# Patient Record
Sex: Male | Born: 1955 | Race: White | Hispanic: No | Marital: Married | Smoking: Former smoker
Health system: Southern US, Community
[De-identification: ages and names within clinical notes are randomized; demographics above are authoritative.]

## PROBLEM LIST (undated history)

## (undated) DIAGNOSIS — Z973 Presence of spectacles and contact lenses: Secondary | ICD-10-CM

## (undated) DIAGNOSIS — I2510 Atherosclerotic heart disease of native coronary artery without angina pectoris: Secondary | ICD-10-CM

## (undated) DIAGNOSIS — I10 Essential (primary) hypertension: Secondary | ICD-10-CM

## (undated) DIAGNOSIS — Z9989 Dependence on other enabling machines and devices: Secondary | ICD-10-CM

## (undated) DIAGNOSIS — J189 Pneumonia, unspecified organism: Secondary | ICD-10-CM

## (undated) DIAGNOSIS — H409 Unspecified glaucoma: Secondary | ICD-10-CM

## (undated) DIAGNOSIS — K219 Gastro-esophageal reflux disease without esophagitis: Secondary | ICD-10-CM

## (undated) DIAGNOSIS — M1990 Unspecified osteoarthritis, unspecified site: Secondary | ICD-10-CM

## (undated) DIAGNOSIS — G4733 Obstructive sleep apnea (adult) (pediatric): Secondary | ICD-10-CM

## (undated) DIAGNOSIS — Z87442 Personal history of urinary calculi: Secondary | ICD-10-CM

## (undated) DIAGNOSIS — I517 Cardiomegaly: Secondary | ICD-10-CM

## (undated) DIAGNOSIS — N189 Chronic kidney disease, unspecified: Secondary | ICD-10-CM

## (undated) HISTORY — PX: EYE SURGERY: SHX253

## (undated) HISTORY — PX: CATARACT EXTRACTION W/ INTRAOCULAR LENS IMPLANT: SHX1309

## (undated) HISTORY — PX: BACK SURGERY: SHX140

## (undated) HISTORY — DX: Chronic kidney disease, unspecified: N18.9

## (undated) HISTORY — DX: Cardiomegaly: I51.7

## (undated) HISTORY — PX: TONSILLECTOMY: SUR1361

## (undated) HISTORY — PX: COLONOSCOPY: SHX174

## (undated) HISTORY — DX: Essential (primary) hypertension: I10

## (undated) HISTORY — PX: REFRACTIVE SURGERY: SHX103

## (undated) HISTORY — DX: Unspecified osteoarthritis, unspecified site: M19.90

---

## 1998-08-10 ENCOUNTER — Observation Stay (HOSPITAL_COMMUNITY): Admission: RE | Admit: 1998-08-10 | Discharge: 1998-08-11 | Payer: Self-pay | Admitting: Specialist

## 1998-08-10 ENCOUNTER — Encounter: Payer: Self-pay | Admitting: Specialist

## 1998-09-09 HISTORY — PX: LUMBAR DISC SURGERY: SHX700

## 1999-03-03 ENCOUNTER — Encounter: Payer: Self-pay | Admitting: Emergency Medicine

## 1999-03-03 ENCOUNTER — Emergency Department (HOSPITAL_COMMUNITY): Admission: EM | Admit: 1999-03-03 | Discharge: 1999-03-03 | Payer: Self-pay | Admitting: Emergency Medicine

## 2011-07-09 ENCOUNTER — Encounter: Payer: Self-pay | Admitting: Gastroenterology

## 2011-07-29 ENCOUNTER — Encounter: Payer: Self-pay | Admitting: Gastroenterology

## 2011-07-29 ENCOUNTER — Ambulatory Visit (AMBULATORY_SURGERY_CENTER): Payer: BC Managed Care – PPO | Admitting: *Deleted

## 2011-07-29 VITALS — Ht 71.0 in | Wt 200.0 lb

## 2011-07-29 DIAGNOSIS — Z1211 Encounter for screening for malignant neoplasm of colon: Secondary | ICD-10-CM

## 2011-07-29 MED ORDER — PEG-KCL-NACL-NASULF-NA ASC-C 100 G PO SOLR: ORAL | Status: DC

## 2011-08-12 ENCOUNTER — Encounter: Payer: Self-pay | Admitting: Gastroenterology

## 2011-08-12 ENCOUNTER — Ambulatory Visit (AMBULATORY_SURGERY_CENTER): Payer: BC Managed Care – PPO | Admitting: Gastroenterology

## 2011-08-12 VITALS — BP 99/70 | HR 64 | Temp 97.9°F | Resp 20 | Ht 71.0 in | Wt 200.0 lb

## 2011-08-12 DIAGNOSIS — Z1211 Encounter for screening for malignant neoplasm of colon: Secondary | ICD-10-CM

## 2011-08-12 DIAGNOSIS — D126 Benign neoplasm of colon, unspecified: Secondary | ICD-10-CM

## 2011-08-12 LAB — HM COLONOSCOPY

## 2011-08-12 MED ORDER — SODIUM CHLORIDE 0.9 % IV SOLN: 500.0000 mL | INTRAVENOUS | Status: DC

## 2011-08-12 NOTE — Patient Instructions (Signed)
Please review discharge instructions (blue and green sheets)  Await pathology- we removed 3 polyps  Resume your regular medications

## 2011-08-12 NOTE — Progress Notes (Signed)
Patient did not have preoperative order for IV antibiotic SSI prophylaxis. (G8918)  Patient did not experience any of the following events: a burn prior to discharge; a fall within the facility; wrong site/side/patient/procedure/implant event; or a hospital transfer or hospital admission upon discharge from the facility. (G8907)  

## 2011-08-13 ENCOUNTER — Telehealth: Payer: Self-pay | Admitting: *Deleted

## 2011-08-13 NOTE — Telephone Encounter (Signed)

## 2012-03-25 ENCOUNTER — Emergency Department (HOSPITAL_COMMUNITY): Payer: BC Managed Care – PPO

## 2012-03-25 ENCOUNTER — Encounter (HOSPITAL_COMMUNITY): Payer: Self-pay

## 2012-03-25 ENCOUNTER — Emergency Department (HOSPITAL_COMMUNITY)
Admission: EM | Admit: 2012-03-25 | Discharge: 2012-03-25 | Disposition: A | Discharge status: Home / Self Care | Payer: BC Managed Care – PPO | Attending: Emergency Medicine | Admitting: Emergency Medicine

## 2012-03-25 DIAGNOSIS — G4730 Sleep apnea, unspecified: Secondary | ICD-10-CM | POA: Insufficient documentation

## 2012-03-25 DIAGNOSIS — N200 Calculus of kidney: Secondary | ICD-10-CM | POA: Insufficient documentation

## 2012-03-25 DIAGNOSIS — Z87891 Personal history of nicotine dependence: Secondary | ICD-10-CM | POA: Insufficient documentation

## 2012-03-25 LAB — URINALYSIS, ROUTINE W REFLEX MICROSCOPIC
Nitrite: NEGATIVE
Specific Gravity, Urine: 1.028 (ref 1.005–1.030)
Urobilinogen, UA: 1 mg/dL (ref 0.0–1.0)

## 2012-03-25 LAB — URINE MICROSCOPIC-ADD ON

## 2012-03-25 LAB — CBC WITH DIFFERENTIAL/PLATELET
Basophils Absolute: 0 10*3/uL (ref 0.0–0.1)
HCT: 45 % (ref 39.0–52.0)
Lymphocytes Relative: 23 % (ref 12–46)
Monocytes Absolute: 0.8 10*3/uL (ref 0.1–1.0)
Neutro Abs: 3.7 10*3/uL (ref 1.7–7.7)
Platelets: 213 10*3/uL (ref 150–400)
RDW: 12.6 % (ref 11.5–15.5)
WBC: 6 10*3/uL (ref 4.0–10.5)

## 2012-03-25 LAB — COMPREHENSIVE METABOLIC PANEL
Alkaline Phosphatase: 64 U/L (ref 39–117)
BUN: 14 mg/dL (ref 6–23)
Creatinine, Ser: 0.92 mg/dL (ref 0.50–1.35)
GFR calc Af Amer: >90 mL/min (ref >90)
Glucose, Bld: 106 mg/dL — ABNORMAL HIGH (ref 70–99)
Potassium: 4.2 mEq/L (ref 3.5–5.1)
Total Protein: 6.8 g/dL (ref 6.0–8.3)

## 2012-03-25 LAB — LIPASE, BLOOD: Lipase: 43 U/L (ref 11–59)

## 2012-03-25 MED ORDER — ONDANSETRON HCL 4 MG/2ML IJ SOLN: 4.0000 mg | Freq: Once | INTRAMUSCULAR | Status: DC

## 2012-03-25 MED ORDER — SODIUM CHLORIDE 0.9 % IV BOLUS (SEPSIS): 1000.0000 mL | Freq: Once | INTRAVENOUS | Status: DC

## 2012-03-25 MED ORDER — TAMSULOSIN HCL 0.4 MG PO CAPS: 0.4000 mg | ORAL_CAPSULE | Freq: Every day | ORAL | Status: DC

## 2012-03-25 MED ORDER — HYDROCODONE-ACETAMINOPHEN 5-325 MG PO TABS: 1.0000 | ORAL_TABLET | ORAL | Status: AC | PRN

## 2012-03-25 MED ORDER — HYDROMORPHONE HCL PF 1 MG/ML IJ SOLN: 0.5000 mg | Freq: Once | INTRAMUSCULAR | Status: DC

## 2012-03-25 MED ORDER — KETOROLAC TROMETHAMINE 30 MG/ML IJ SOLN: 30.0000 mg | Freq: Once | INTRAMUSCULAR | Status: DC

## 2012-03-25 NOTE — ED Notes (Signed)
Pt states he does not want an iv or pain medication at this time. Pt states he may have passed the stone early. edmd notified

## 2012-03-25 NOTE — ED Notes (Signed)
Patient c/o right flank pain and has a history of kidney stones.  Patient states the pain is not as bad as it was earlier. Patient denies hematuria.

## 2012-03-25 NOTE — ED Provider Notes (Addendum)
History     CSN: [id number]  Arrival date & time 03/25/12  1219   First MD Initiated Contact with Patient 03/25/12 1220      Chief Complaint  Patient presents with  . Flank Pain    (Consider location/radiation/quality/duration/timing/severity/associated sxs/prior treatment) Patient is a 56 y.o. male presenting with flank pain.  Flank Pain   the patient presents with the acute onset of flank pain.  He notes that approximately 2 hours ago while sitting he suddenly developed sharp, crampy pain about the right flank.  Pain radiated circumferentially towards his inguinal crease and into his scrotum.  The pain was most prominent at onset, has improved spontaneously without specific attempts at relief.  He notes that there was initially a sudden urge to urinate, but there is no urine production.  He denies other abdominal pain, fevers, chills, nausea, vomiting, diarrhea. He states that this pain is reminiscent of pain he had approximately 12 years ago during a kidney stone episode.  Past Medical History  Diagnosis Date  . Allergy     poison ivy and oak  . Sleep apnea     c-pap  . Kidney stone     Past Surgical History  Procedure Date  . Lumbar disc surgery 2000    L4-5  . Conoscopy     History reviewed. No pertinent family history.  History  Substance Use Topics  . Smoking status: Former Games developer  . Smokeless tobacco: Never Used  . Alcohol Use: 0.6 oz/week    1 Cans of beer per week     daily      Review of Systems  Constitutional:       Per HPI, otherwise negative  HENT:       Per HPI, otherwise negative  Eyes: Negative.   Respiratory:       Per HPI, otherwise negative  Cardiovascular:       Per HPI, otherwise negative  Gastrointestinal: Negative for vomiting.  Genitourinary: Positive for flank pain.  Musculoskeletal:       Per HPI, otherwise negative  Skin: Negative.   Neurological: Negative for syncope.    Allergies  Review of patient's allergies  indicates no known allergies.  Home Medications  No current outpatient prescriptions on file.  BP 146/91  Pulse 71  Temp 98.6 F (37 C)  Resp 20  SpO2 100%  Physical Exam  Nursing note and vitals reviewed. Constitutional: He is oriented to person, place, and time. He appears well-developed. No distress.  HENT:  Head: Normocephalic and atraumatic.  Eyes: Conjunctivae and EOM are normal.  Cardiovascular: Normal rate and regular rhythm.   Pulmonary/Chest: Effort normal. No stridor. No respiratory distress.  Abdominal: He exhibits no distension. There is tenderness. There is CVA tenderness.  Musculoskeletal: He exhibits no edema.  Neurological: He is alert and oriented to person, place, and time.  Skin: Skin is warm and dry.  Psychiatric: He has a normal mood and affect.    ED Course  Procedures (including critical care time)   Labs Reviewed  COMPREHENSIVE METABOLIC PANEL  CBC WITH DIFFERENTIAL  LIPASE, BLOOD  URINALYSIS, ROUTINE W REFLEX MICROSCOPIC   No results found.   No diagnosis found.   After my initial evaluation the patient went to the washroom to provide a sample, noted the passage of something solid in his urine MDM  This generally well-appearing male presents with the acute onset of right flank pain.  Given the patient's history of kidney stones, the description of sharp  and crampy pain in his right flank there was immediate suspicion of kidney stone.  Patient's urinalysis and CT scan are consistent with the presence of multiple stones.  There is no evidence of ongoing infection.  Patient was discharged in stable condition to followup with urology for continued evaluation of multiple remaining stones      Gerhard Munch, MD 03/25/12 1450  Gerhard Munch, MD 04/09/12 [phone]

## 2012-05-21 ENCOUNTER — Ambulatory Visit: Payer: Self-pay

## 2012-11-04 ENCOUNTER — Telehealth: Payer: Self-pay | Admitting: Radiology

## 2012-11-04 NOTE — Telephone Encounter (Signed)
Pharmacy ? Written rx they will send [place] the hard copy.

## 2013-07-10 IMAGING — CT CT ABD-PELV W/O CM
1 series · 16 of 30 positions shown, 20 images · non-contrast
Comparison: None.

CLINICAL DATA: Right flank pain.

CT ABDOMEN AND PELVIS WITHOUT CONTRAST
TECHNIQUE: Multidetector CT imaging of the abdomen and pelvis was
performed following the standard protocol without intravenous
contrast.

[Series 4: lung · axial · 0.78mm/px · z∈[-238,-108]mm · 16 of 30 slices shown, 20 images]
[im 3/30  soft-tissue]
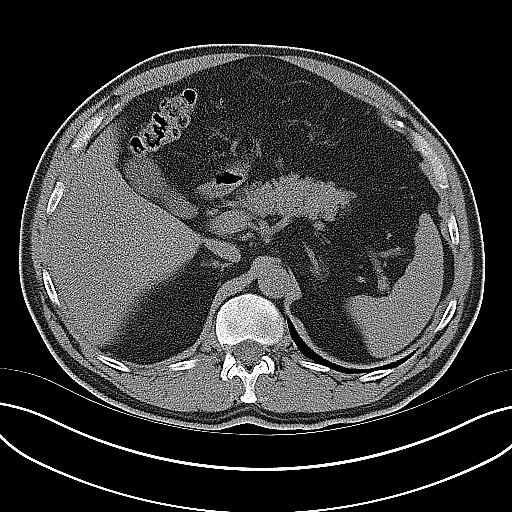
[im 3/30  bone]
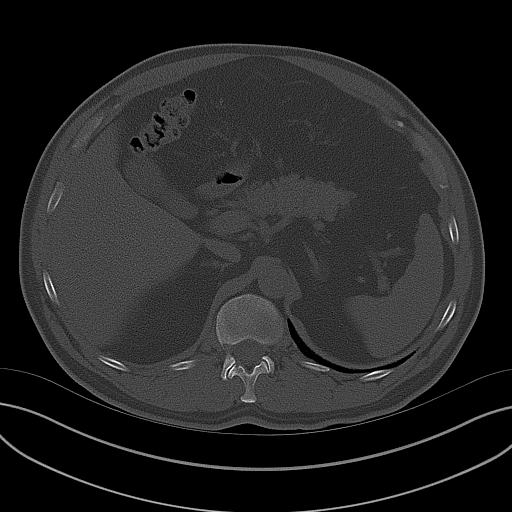
[im 5/30  soft-tissue]
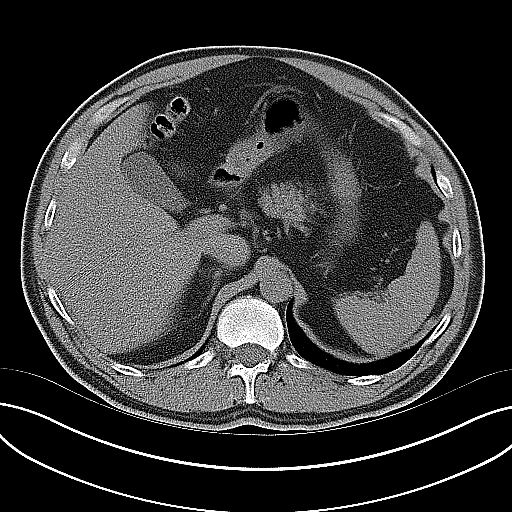
[im 7/30  soft-tissue]
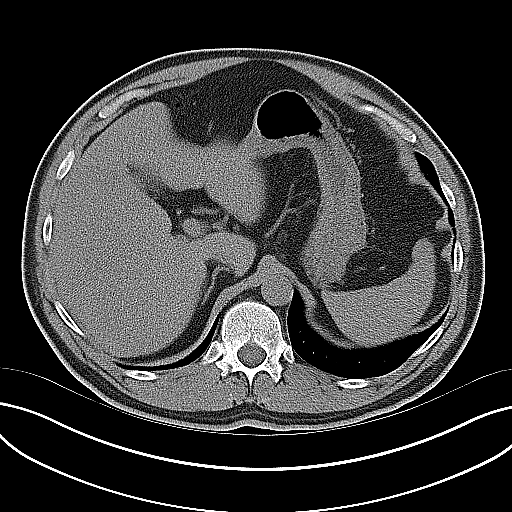
[im 9/30  soft-tissue]
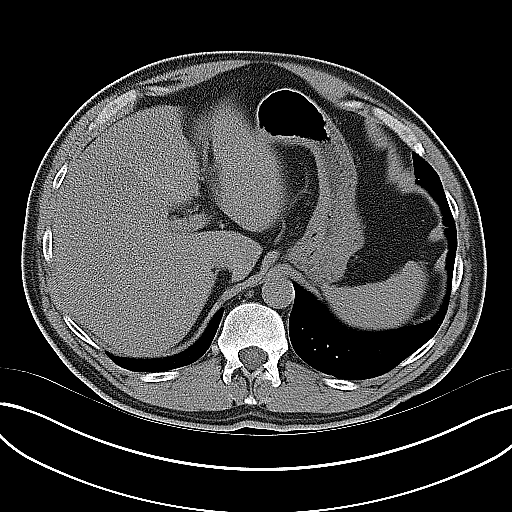
[im 11/30  soft-tissue]
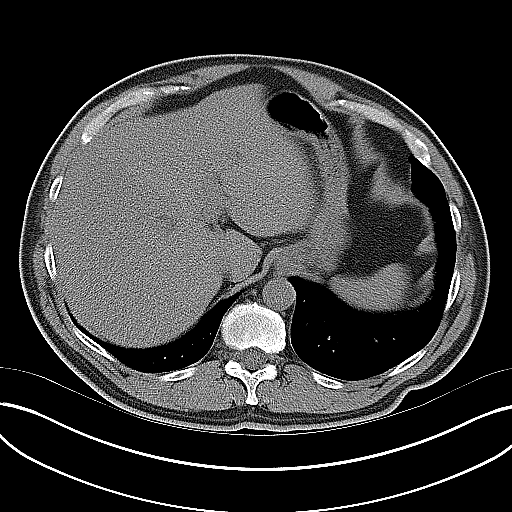
[im 13/30  soft-tissue]
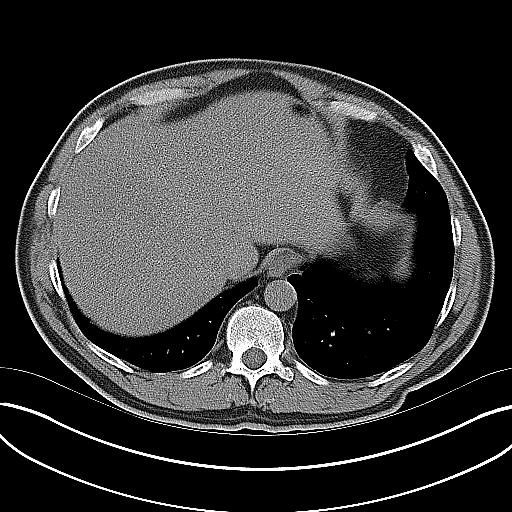
[im 15/30  soft-tissue]
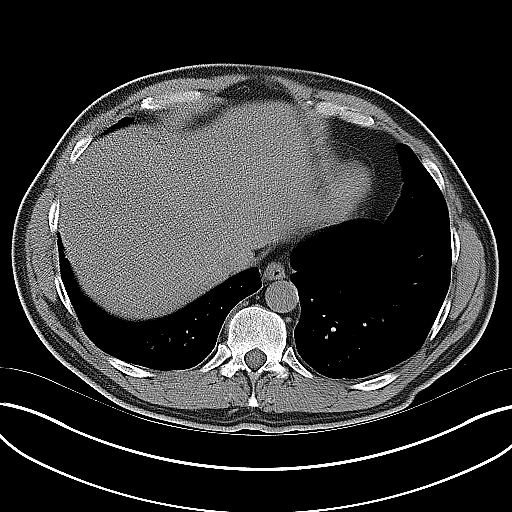
[im 17/30  soft-tissue]
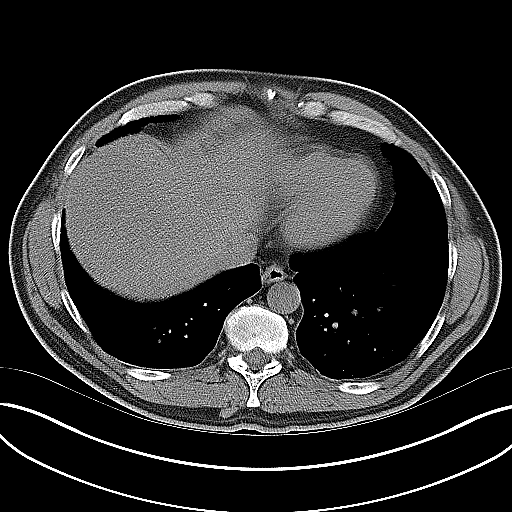
[im 19/30  soft-tissue]
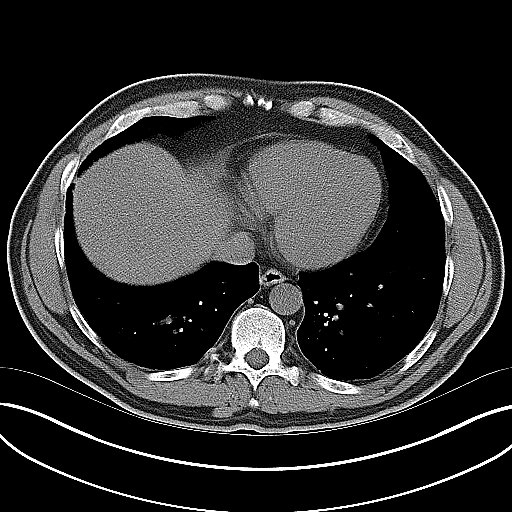
[im 19/30  bone]
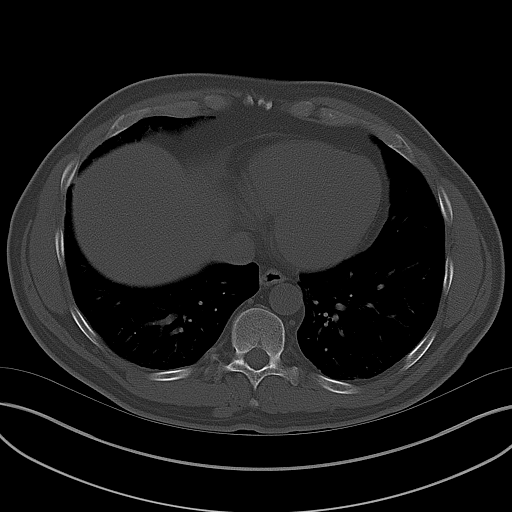
[im 21/30  soft-tissue]
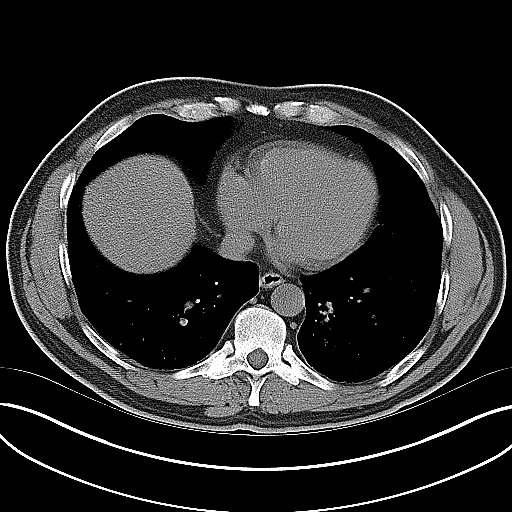
[im 23/30  soft-tissue]
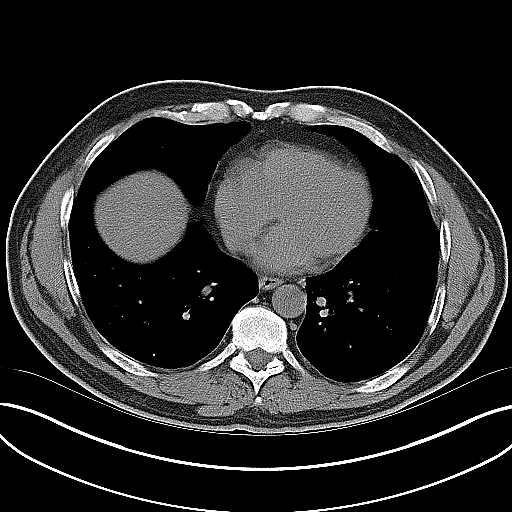
[im 25/30  soft-tissue]
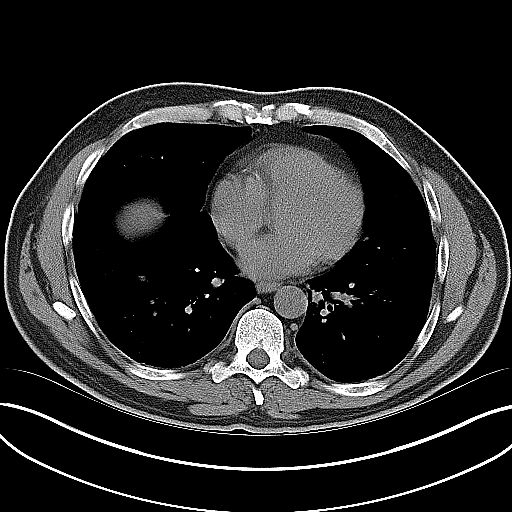
[im 26/30  lung]
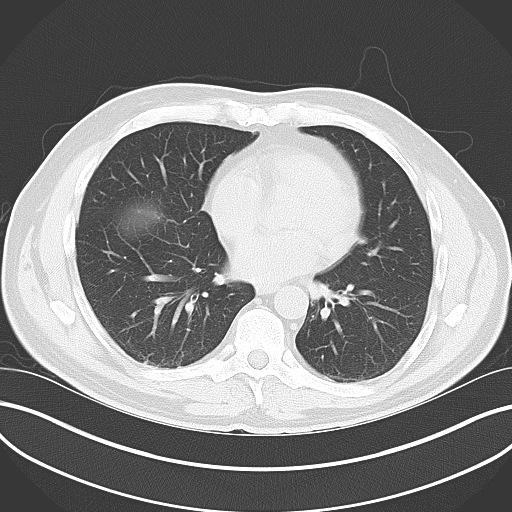
[im 27/30  soft-tissue]
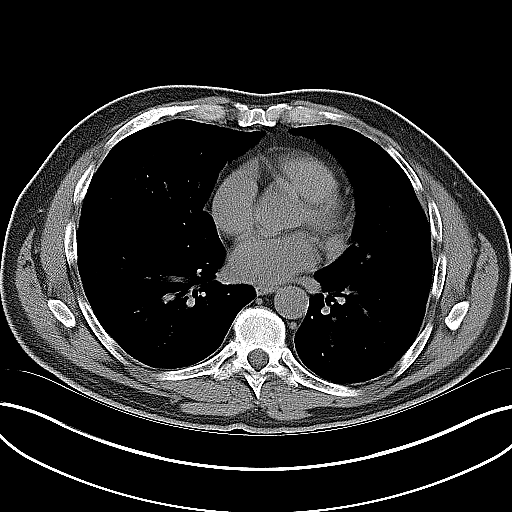
[im 27/30  lung]
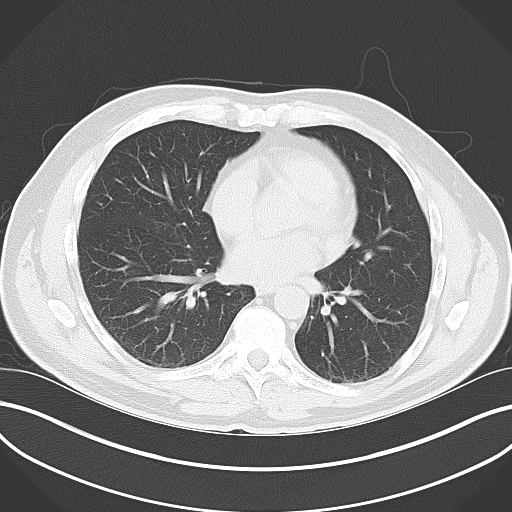
[im 28/30  lung]
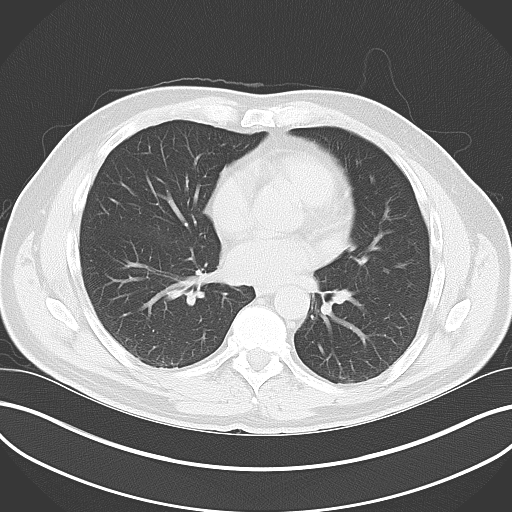
[im 29/30  soft-tissue]
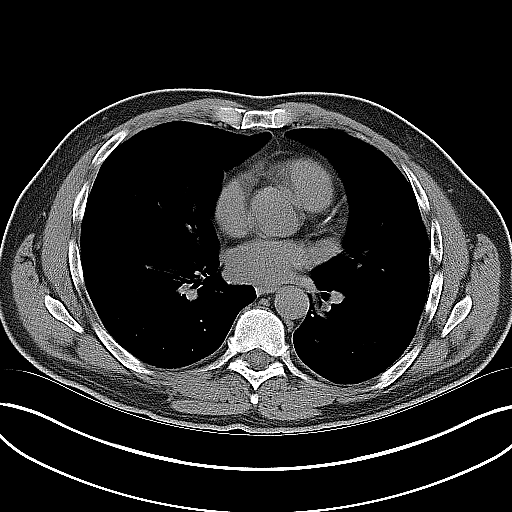
[im 29/30  lung]
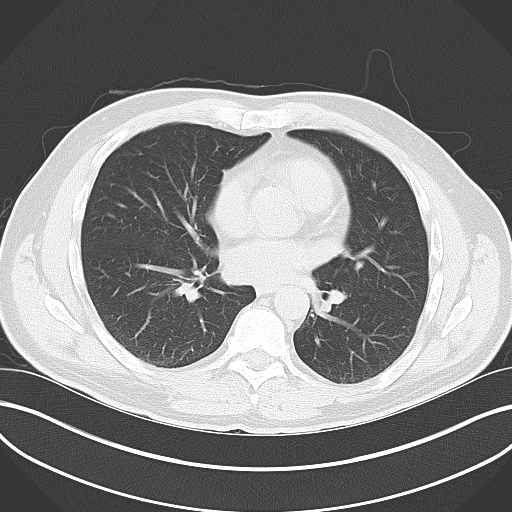

[16 of 30 positions shown; findings below may reference images not displayed]

FINDINGS: Lung bases are clear.  No effusions.  Heart is normal
size.  Coronary artery calcifications.

Mild diffuse fatty infiltration of the liver suspected with area of
focal fatty sparing near the gallbladder fossa.  Gallbladder,
spleen, pancreas, adrenals and left kidney have an unremarkable
unenhanced appearance.  Multiple nonobstructing right renal stones,
the largest in the lower pole measuring 11 mm.  No hydronephrosis.
No ureteral stones.  Urinary bladder is unremarkable.

Appendix is visualized and is unremarkable. Bowel grossly
unremarkable.  No free fluid, free air, or adenopathy. No acute
bony abnormality.  Degenerative changes at the L5 S1 level.
IMPRESSION: Right nephrolithiasis.  No ureteral stones or hydronephrosis.

No acute findings.

## 2013-09-09 HISTORY — PX: CATARACT EXTRACTION W/ INTRAOCULAR LENS IMPLANT: SHX1309

## 2014-04-04 HISTORY — PX: RETINAL DETACHMENT SURGERY: SHX105

## 2014-04-13 ENCOUNTER — Emergency Department (HOSPITAL_COMMUNITY): Payer: BC Managed Care – PPO

## 2014-04-13 ENCOUNTER — Other Ambulatory Visit: Payer: Self-pay | Admitting: Urology

## 2014-04-13 ENCOUNTER — Encounter (HOSPITAL_COMMUNITY): Payer: Self-pay | Admitting: Emergency Medicine

## 2014-04-13 ENCOUNTER — Emergency Department (HOSPITAL_COMMUNITY)
Admission: EM | Admit: 2014-04-13 | Discharge: 2014-04-13 | Disposition: A | Discharge status: Home / Self Care | Payer: BC Managed Care – PPO | Attending: Emergency Medicine | Admitting: Emergency Medicine

## 2014-04-13 DIAGNOSIS — N23 Unspecified renal colic: Secondary | ICD-10-CM | POA: Insufficient documentation

## 2014-04-13 DIAGNOSIS — IMO0002 Reserved for concepts with insufficient information to code with codable children: Secondary | ICD-10-CM | POA: Insufficient documentation

## 2014-04-13 DIAGNOSIS — Z79899 Other long term (current) drug therapy: Secondary | ICD-10-CM | POA: Insufficient documentation

## 2014-04-13 DIAGNOSIS — G4730 Sleep apnea, unspecified: Secondary | ICD-10-CM | POA: Insufficient documentation

## 2014-04-13 DIAGNOSIS — Z87891 Personal history of nicotine dependence: Secondary | ICD-10-CM | POA: Insufficient documentation

## 2014-04-13 DIAGNOSIS — Z87442 Personal history of urinary calculi: Secondary | ICD-10-CM | POA: Insufficient documentation

## 2014-04-13 DIAGNOSIS — R109 Unspecified abdominal pain: Secondary | ICD-10-CM | POA: Insufficient documentation

## 2014-04-13 LAB — BASIC METABOLIC PANEL
Anion gap: 15 (ref 5–15)
BUN: 15 mg/dL (ref 6–23)
CHLORIDE: 105 meq/L (ref 96–112)
CO2: 16 mEq/L — ABNORMAL LOW (ref 19–32)
Calcium: 9.5 mg/dL (ref 8.4–10.5)
Creatinine, Ser: 1.07 mg/dL (ref 0.50–1.35)
GFR calc Af Amer: 86 mL/min — ABNORMAL LOW (ref >90)
GFR, EST NON AFRICAN AMERICAN: 74 mL/min — AB (ref >90)
GLUCOSE: 163 mg/dL — AB (ref 70–99)
POTASSIUM: 4.5 meq/L (ref 3.7–5.3)
SODIUM: 136 meq/L — AB (ref 137–147)

## 2014-04-13 LAB — URINALYSIS, ROUTINE W REFLEX MICROSCOPIC
Bilirubin Urine: NEGATIVE
GLUCOSE, UA: NEGATIVE mg/dL
Hgb urine dipstick: NEGATIVE
Ketones, ur: NEGATIVE mg/dL
Nitrite: NEGATIVE
PROTEIN: NEGATIVE mg/dL
SPECIFIC GRAVITY, URINE: 1.018 (ref 1.005–1.030)
UROBILINOGEN UA: 1 mg/dL (ref 0.0–1.0)
pH: 7 (ref 5.0–8.0)

## 2014-04-13 LAB — CBC WITH DIFFERENTIAL/PLATELET
BASOS PCT: 0 % (ref 0–1)
Basophils Absolute: 0 10*3/uL (ref 0.0–0.1)
EOS ABS: 0 10*3/uL (ref 0.0–0.7)
EOS PCT: 0 % (ref 0–5)
HEMATOCRIT: 50.4 % (ref 39.0–52.0)
HEMOGLOBIN: 17.5 g/dL — AB (ref 13.0–17.0)
LYMPHS PCT: 15 % (ref 12–46)
Lymphs Abs: 2.1 10*3/uL (ref 0.7–4.0)
MCH: 32.8 pg (ref 26.0–34.0)
MCHC: 34.7 g/dL (ref 30.0–36.0)
MCV: 94.4 fL (ref 78.0–100.0)
MONOS PCT: 8 % (ref 3–12)
Monocytes Absolute: 1.1 10*3/uL — ABNORMAL HIGH (ref 0.1–1.0)
NEUTROS ABS: 10.8 10*3/uL — AB (ref 1.7–7.7)
NEUTROS PCT: 77 % (ref 43–77)
Platelets: 228 10*3/uL (ref 150–400)
RBC: 5.34 MIL/uL (ref 4.22–5.81)
RDW: 12.4 % (ref 11.5–15.5)
WBC: 14 10*3/uL — AB (ref 4.0–10.5)

## 2014-04-13 LAB — URINE MICROSCOPIC-ADD ON

## 2014-04-13 MED ORDER — HYDROMORPHONE HCL PF 1 MG/ML IJ SOLN
0.5000 mg | Freq: Once | INTRAMUSCULAR | Status: AC
  Administered 2014-04-13: 0.5 mg via INTRAVENOUS
  Filled 2014-04-13: qty 1

## 2014-04-13 MED ORDER — KETOROLAC TROMETHAMINE 30 MG/ML IJ SOLN
30.0000 mg | Freq: Once | INTRAMUSCULAR | Status: AC
  Administered 2014-04-13: 30 mg via INTRAVENOUS
  Filled 2014-04-13: qty 1

## 2014-04-13 MED ORDER — OXYCODONE-ACETAMINOPHEN 5-325 MG PO TABS: 1.0000 | ORAL_TABLET | ORAL | Status: DC | PRN

## 2014-04-13 MED ORDER — TAMSULOSIN HCL 0.4 MG PO CAPS: 0.4000 mg | ORAL_CAPSULE | Freq: Every day | ORAL | Status: DC

## 2014-04-13 MED ORDER — ONDANSETRON HCL 4 MG/2ML IJ SOLN
4.0000 mg | Freq: Once | INTRAMUSCULAR | Status: AC
  Administered 2014-04-13: 4 mg via INTRAVENOUS
  Filled 2014-04-13: qty 2

## 2014-04-13 NOTE — ED Notes (Signed)
MD at bedside. EDPA PRESENT TO RE EVALUATE PT

## 2014-04-13 NOTE — ED Provider Notes (Signed)
CSN: [id number]     Arrival date & time 04/13/14  0616 History   First MD Initiated Contact with Patient 04/13/14 [phone]     Chief Complaint  Patient presents with  . Flank Pain     (Consider location/radiation/quality/duration/timing/severity/associated sxs/prior Treatment) HPI Steve Howell is a 58 y.o. male who presents to ED with complaint of left flank pain. Pt states he woke up at 4;30 am with severe pain in the side. States hx of similar pain 2 years ago when was diagnosed with a "pocket of multiple stones." States followed by Dr.Eskridge with urology, but was told there is nothing to do for these stones. States he has not had any problems with his side until today. He does recall a "tinge of sharp pain" few days ago, but it lasted just few minutes and went away. Pt admits to associated nausea, no vomiting. No fever, chills. No changes in bowels, but states he is passing a lot of gas. No pain with urination. States urine looks like "there is tissue in it or something."   Past Medical History  Diagnosis Date  . Allergy     poison ivy and oak  . Sleep apnea     c-pap  . Kidney stone    Past Surgical History  Procedure Laterality Date  . Lumbar disc surgery  2000    L4-5  . Conoscopy    . Retinal detachment surgery     History reviewed. No pertinent family history. History  Substance Use Topics  . Smoking status: Former Research scientist (life sciences)  . Smokeless tobacco: Never Used  . Alcohol Use: 0.6 oz/week    1 Cans of beer per week     Comment: daily    Review of Systems  Constitutional: Negative for fever and chills.  Respiratory: Negative for cough, chest tightness and shortness of breath.   Cardiovascular: Negative for chest pain, palpitations and leg swelling.  Gastrointestinal: Positive for abdominal pain. Negative for nausea, vomiting, diarrhea and abdominal distention.  Genitourinary: Positive for flank pain. Negative for dysuria, urgency, frequency and hematuria.   Musculoskeletal: Negative for arthralgias, myalgias, neck pain and neck stiffness.  Skin: Negative for rash.  Allergic/Immunologic: Negative for immunocompromised state.  Neurological: Negative for dizziness, weakness, light-headedness, numbness and headaches.      Allergies  Review of patient's allergies indicates no known allergies.  Home Medications   Prior to Admission medications   Medication Sig Start Date End Date Taking? Authorizing Provider  acetaZOLAMIDE (DIAMOX) 500 MG capsule Take 500 mg by mouth 2 (two) times daily.   Yes Historical Provider, MD  brinzolamide (AZOPT) 1 % ophthalmic suspension Place 1 drop into the right eye 3 (three) times daily.   Yes Historical Provider, MD  latanoprost (XALATAN) 0.005 % ophthalmic solution Place 1 drop into the right eye at bedtime.   Yes Historical Provider, MD  oxyCODONE-acetaminophen (PERCOCET/ROXICET) 5-325 MG per tablet Take 1 tablet by mouth every 4 (four) hours as needed. pain 04/05/14  Yes Historical Provider, MD  prednisoLONE acetate (PRED FORTE) 1 % ophthalmic suspension Place 1 drop into the right eye 4 (four) times daily.    Yes Historical Provider, MD  timolol (BETIMOL) 0.5 % ophthalmic solution Place 1 drop into the right eye 2 (two) times daily.   Yes Historical Provider, MD   BP 173/97  Pulse 70  Temp(Src) 98 F (36.7 C) (Oral)  Resp 20  Ht 5\' 11"  (1.803 m)  Wt 195 lb (88.451 kg)  BMI 27.21 kg/m2  SpO2 100% Physical Exam  Nursing note and vitals reviewed. Constitutional: He appears well-developed and well-nourished.  Uncomfortable appearing  HENT:  Head: Normocephalic and atraumatic.  Eyes: Conjunctivae are normal.  Neck: Neck supple.  Cardiovascular: Normal rate, regular rhythm and normal heart sounds.   Pulmonary/Chest: Effort normal. No respiratory distress. He has no wheezes. He has no rales.  Abdominal: Soft. Bowel sounds are normal. He exhibits no distension. There is tenderness. There is no rebound.   Left upper and lower abdominal tenderness. No CVA tenderness bilaterally  Musculoskeletal: He exhibits no edema.  Neurological: He is alert.  Skin: Skin is warm and dry.    ED Course  Procedures (including critical care time) Labs Review Labs Reviewed  CBC WITH DIFFERENTIAL - Abnormal; Notable for the following:    WBC 14.0 (*)    Hemoglobin 17.5 (*)    Neutro Abs 10.8 (*)    Monocytes Absolute 1.1 (*)    All other components within normal limits  BASIC METABOLIC PANEL - Abnormal; Notable for the following:    Sodium 136 (*)    CO2 16 (*)    Glucose, Bld 163 (*)    GFR calc non Af Amer 74 (*)    GFR calc Af Amer 86 (*)    All other components within normal limits  URINALYSIS, ROUTINE W REFLEX MICROSCOPIC - Abnormal; Notable for the following:    APPearance TURBID (*)    Leukocytes, UA SMALL (*)    All other components within normal limits  URINE CULTURE  URINE MICROSCOPIC-ADD ON    Imaging Review Ct Abdomen Pelvis Wo Contrast  04/13/2014   CLINICAL DATA:  Left flank pain  EXAM: CT ABDOMEN AND PELVIS WITHOUT CONTRAST  TECHNIQUE: Multidetector CT imaging of the abdomen and pelvis was performed following the standard protocol without IV contrast.  COMPARISON:  03/25/2012  FINDINGS: Mild left hydronephrosis. There is stranding along the course of the proximal half of the left ureter. No ureteral calculus. Tiny calculi in the collecting system of the left kidney. There are calculi in the collecting system of the right kidney. The largest on the right is 13 mm.  Unenhanced liver, gallbladder, spleen, pancreas, adrenal glands are within normal limits.  Normal appendix.  Bladder decompressed.  Minimal diverticulosis of the sigmoid colon.  Soft tissue density in the distal right inguinal region may represent a partially descended right testicle.  Advanced L5-S1 degenerative disc disease.  IMPRESSION: There is stranding along the course of the left ureter and mild left hydronephrosis without  ureteral calculus. These findings may represent a recently passed left ureteral calculus or possibly an inflammatory process.  Bilateral nephrolithiasis.  Possible partially descended right testicle. Correlate clinically as for the need for ultrasound.   Electronically Signed   By: Maryclare Bean M.D.   On: 04/13/2014 07:24     EKG Interpretation None      MDM   Final diagnoses:  Renal colic on left side   Patient is here with left flank pain, history of kidney stones. Patient appears to be very uncomfortable, will try pain medications, urinalysis pending, labs pending, CT abdomen and pelvis without contrast pending. Blood pressure initially elevated, came down with pain management.  8:42 AM pt still having pain after two doses of dilaudid. Will try toradol.  9:35 AM pt's pain resolved with toradol. Appears to be comfortable. Urine with no signs of infection. Cultures sent. Will dc home with pain medications. Follow up with urology.   Filed Vitals:  04/13/14 2763 04/13/14 0636 04/13/14 0913 04/13/14 1001  BP:   118/64 123/64  Pulse:   81 86  Temp: 97.6 F (36.4 C) 98 F (36.7 C) 98.8 F (37.1 C) 98.1 F (36.7 C)  TempSrc: Oral Oral Oral Oral  Resp:   12 14  Height:      Weight:      SpO2:   98% 96%     Xian Apostol A Tahjir Silveria, PA-C 04/13/14 1030  Navayah Sok A Aurore Redinger, PA-C 04/13/14 1030

## 2014-04-13 NOTE — ED Notes (Signed)
Pt states he started having left flank pain about 430 this morning  Pt has nausea without vomiting

## 2014-04-13 NOTE — ED Provider Notes (Signed)
  Face-to-face evaluation   History: He reports left flank pain which started early this morning. The pain is persistent. He has urinary urgency and frequency, but no dysuria, or hematuria. He feels like this is a kidney stone problem. He has not had a kidney stone problem recently.   Physical exam: Alert, calm, cooperative. He appears uncomfortable. Abdomen soft, and nontender. No mass. Genitals- normal penis and testicles, normal scrotum bilaterally. No scrotal or inguinal masses.  Medical screening examination/treatment/procedure(s) were conducted as a shared visit with non-physician practitioner(s) and myself.  I personally evaluated the patient during the encounter  Richarda Blade, MD 04/13/14 1843

## 2014-04-13 NOTE — Discharge Instructions (Signed)
You have been medicated in ED, DO NOT DRIVE for the next 6 hrs.   Percocet for severe pain, do not drive while taking this medicatio. flomax to help you pass the stone if there is one. Follow up with urology as soon as able if pain continues.    Kidney Stones Kidney stones (urolithiasis) are deposits that form inside your kidneys. The intense pain is caused by the stone moving through the urinary tract. When the stone moves, the ureter goes into spasm around the stone. The stone is usually passed in the urine.  CAUSES   A disorder that makes certain neck glands produce too much parathyroid hormone (primary hyperparathyroidism).  A buildup of uric acid crystals, similar to gout in your joints.  Narrowing (stricture) of the ureter.  A kidney obstruction present at birth (congenital obstruction).  Previous surgery on the kidney or ureters.  Numerous kidney infections. SYMPTOMS   Feeling sick to your stomach (nauseous).  Throwing up (vomiting).  Blood in the urine (hematuria).  Pain that usually spreads (radiates) to the groin.  Frequency or urgency of urination. DIAGNOSIS   Taking a history and physical exam.  Blood or urine tests.  CT scan.  Occasionally, an examination of the inside of the urinary bladder (cystoscopy) is performed. TREATMENT   Observation.  Increasing your fluid intake.  Extracorporeal shock wave lithotripsy--This is a noninvasive procedure that uses shock waves to break up kidney stones.  Surgery may be needed if you have severe pain or persistent obstruction. There are various surgical procedures. Most of the procedures are performed with the use of small instruments. Only small incisions are needed to accommodate these instruments, so recovery time is minimized. The size, location, and chemical composition are all important variables that will determine the proper choice of action for you. Talk to your health care provider to better understand your  situation so that you will minimize the risk of injury to yourself and your kidney.  HOME CARE INSTRUCTIONS   Drink enough water and fluids to keep your urine clear or pale yellow. This will help you to pass the stone or stone fragments.  Strain all urine through the provided strainer. Keep all particulate matter and stones for your health care provider to see. The stone causing the pain may be as small as a grain of salt. It is very important to use the strainer each and every time you pass your urine. The collection of your stone will allow your health care provider to analyze it and verify that a stone has actually passed. The stone analysis will often identify what you can do to reduce the incidence of recurrences.  Only take over-the-counter or prescription medicines for pain, discomfort, or fever as directed by your health care provider.  Make a follow-up appointment with your health care provider as directed.  Get follow-up X-rays if required. The absence of pain does not always mean that the stone has passed. It may have only stopped moving. If the urine remains completely obstructed, it can cause loss of kidney function or even complete destruction of the kidney. It is your responsibility to make sure X-rays and follow-ups are completed. Ultrasounds of the kidney can show blockages and the status of the kidney. Ultrasounds are not associated with any radiation and can be performed easily in a matter of minutes. SEEK MEDICAL CARE IF:  You experience pain that is progressive and unresponsive to any pain medicine you have been prescribed. SEEK IMMEDIATE MEDICAL CARE IF:  Pain cannot be controlled with the prescribed medicine.  You have a fever or shaking chills.  The severity or intensity of pain increases over 18 hours and is not relieved by pain medicine.  You develop a new onset of abdominal pain.  You feel faint or pass out.  You are unable to urinate. MAKE SURE YOU:    Understand these instructions.  Will watch your condition.  Will get help right away if you are not doing well or get worse. Document Released: 08/26/2005 Document Revised: 04/28/2013 Document Reviewed: 01/27/2013 [facility] Patient Information 2015 [place], [place]. This information is not intended to replace advice given to you by your health care provider. Make sure you discuss any questions you have with your health care provider.

## 2014-04-14 LAB — URINE CULTURE
Colony Count: NO GROWTH
Culture: NO GROWTH

## 2014-05-12 ENCOUNTER — Other Ambulatory Visit: Payer: Self-pay | Admitting: Urology

## 2014-07-01 ENCOUNTER — Encounter (HOSPITAL_BASED_OUTPATIENT_CLINIC_OR_DEPARTMENT_OTHER): Payer: Self-pay | Admitting: *Deleted

## 2014-07-01 NOTE — Progress Notes (Signed)
NPO AFTER MN.  ARRIVE AT 0600.  NEEDS HG.  

## 2014-07-07 NOTE — Anesthesia Preprocedure Evaluation (Addendum)
Anesthesia Evaluation  Patient identified by MRN, date of birth, ID band Patient awake    Reviewed: Allergy & Precautions, H&P , NPO status , Patient's Chart, lab work & pertinent test results  Airway Mallampati: III  TM Distance: >3 FB Neck ROM: full    Dental no notable dental hx. (+) Teeth Intact, Dental Advisory Given   Pulmonary neg pulmonary ROS, sleep apnea and Continuous Positive Airway Pressure Ventilation , former smoker,  breath sounds clear to auscultation  Pulmonary exam normal       Cardiovascular Exercise Tolerance: Good negative cardio ROS  Rhythm:regular Rate:Normal     Neuro/Psych glaucoma negative neurological ROS  negative psych ROS   GI/Hepatic negative GI ROS, Neg liver ROS,   Endo/Other  negative endocrine ROS  Renal/GU negative Renal ROS  negative genitourinary   Musculoskeletal   Abdominal   Peds  Hematology negative hematology ROS (+)   Anesthesia Other Findings   Reproductive/Obstetrics negative OB ROS                            Anesthesia Physical Anesthesia Plan  ASA: III  Anesthesia Plan: General   Post-op Pain Management:    Induction: Intravenous  Airway Management Planned: LMA  Additional Equipment:   Intra-op Plan:   Post-operative Plan:   Informed Consent: I have reviewed the patients History and Physical, chart, labs and discussed the procedure including the risks, benefits and alternatives for the proposed anesthesia with the patient or authorized representative who has indicated his/her understanding and acceptance.   Dental Advisory Given  Plan Discussed with: CRNA and Surgeon  Anesthesia Plan Comments:         Anesthesia Quick Evaluation

## 2014-07-08 ENCOUNTER — Ambulatory Visit (HOSPITAL_BASED_OUTPATIENT_CLINIC_OR_DEPARTMENT_OTHER): Payer: BC Managed Care – PPO | Admitting: Anesthesiology

## 2014-07-08 ENCOUNTER — Encounter (HOSPITAL_BASED_OUTPATIENT_CLINIC_OR_DEPARTMENT_OTHER): Payer: Self-pay | Admitting: *Deleted

## 2014-07-08 ENCOUNTER — Ambulatory Visit (HOSPITAL_BASED_OUTPATIENT_CLINIC_OR_DEPARTMENT_OTHER)
Admission: RE | Admit: 2014-07-08 | Discharge: 2014-07-08 | Disposition: A | Discharge status: Home / Self Care | Payer: BC Managed Care – PPO | Source: Ambulatory Visit | Attending: Urology | Admitting: Urology

## 2014-07-08 ENCOUNTER — Encounter (HOSPITAL_BASED_OUTPATIENT_CLINIC_OR_DEPARTMENT_OTHER)
Admission: RE | Disposition: A | Discharge status: Home / Self Care | Payer: Self-pay | Source: Ambulatory Visit | Attending: Urology

## 2014-07-08 ENCOUNTER — Encounter (HOSPITAL_BASED_OUTPATIENT_CLINIC_OR_DEPARTMENT_OTHER): Payer: BC Managed Care – PPO | Admitting: Anesthesiology

## 2014-07-08 DIAGNOSIS — Z888 Allergy status to other drugs, medicaments and biological substances status: Secondary | ICD-10-CM | POA: Diagnosis not present

## 2014-07-08 DIAGNOSIS — N132 Hydronephrosis with renal and ureteral calculous obstruction: Secondary | ICD-10-CM | POA: Insufficient documentation

## 2014-07-08 DIAGNOSIS — Z87891 Personal history of nicotine dependence: Secondary | ICD-10-CM | POA: Diagnosis not present

## 2014-07-08 DIAGNOSIS — G4733 Obstructive sleep apnea (adult) (pediatric): Secondary | ICD-10-CM | POA: Insufficient documentation

## 2014-07-08 DIAGNOSIS — K219 Gastro-esophageal reflux disease without esophagitis: Secondary | ICD-10-CM | POA: Diagnosis not present

## 2014-07-08 HISTORY — DX: Dependence on other enabling machines and devices: Z99.89

## 2014-07-08 HISTORY — DX: Gastro-esophageal reflux disease without esophagitis: K21.9

## 2014-07-08 HISTORY — DX: Presence of spectacles and contact lenses: Z97.3

## 2014-07-08 HISTORY — DX: Personal history of urinary calculi: Z87.442

## 2014-07-08 HISTORY — PX: CYSTOSCOPY WITH RETROGRADE PYELOGRAM, URETEROSCOPY AND STENT PLACEMENT: SHX5789

## 2014-07-08 HISTORY — DX: Obstructive sleep apnea (adult) (pediatric): G47.33

## 2014-07-08 LAB — POCT HEMOGLOBIN-HEMACUE: Hemoglobin: 15.9 g/dL (ref 13.0–17.0)

## 2014-07-08 SURGERY — CYSTOURETEROSCOPY, WITH RETROGRADE PYELOGRAM AND STENT INSERTION
Anesthesia: General | Site: Ureter | Laterality: Right

## 2014-07-08 MED ORDER — ACETAMINOPHEN 10 MG/ML IV SOLN
INTRAVENOUS | Status: DC | PRN
  Administered 2014-07-08: 1000 mg via INTRAVENOUS

## 2014-07-08 MED ORDER — CEFAZOLIN SODIUM-DEXTROSE 2-3 GM-% IV SOLR
2.0000 g | INTRAVENOUS | Status: AC
  Administered 2014-07-08: 2 g via INTRAVENOUS
  Filled 2014-07-08: qty 50

## 2014-07-08 MED ORDER — CEFAZOLIN SODIUM 1-5 GM-% IV SOLN
1.0000 g | INTRAVENOUS | Status: DC
  Filled 2014-07-08: qty 50

## 2014-07-08 MED ORDER — CEFAZOLIN SODIUM-DEXTROSE 2-3 GM-% IV SOLR
INTRAVENOUS | Status: AC
  Filled 2014-07-08: qty 50

## 2014-07-08 MED ORDER — STERILE WATER FOR IRRIGATION IR SOLN
Status: DC | PRN
  Administered 2014-07-08: 500 mL

## 2014-07-08 MED ORDER — MIDAZOLAM HCL 2 MG/2ML IJ SOLN
INTRAMUSCULAR | Status: AC
  Filled 2014-07-08: qty 2

## 2014-07-08 MED ORDER — FENTANYL CITRATE 0.05 MG/ML IJ SOLN
25.0000 ug | INTRAMUSCULAR | Status: DC | PRN
  Filled 2014-07-08: qty 1

## 2014-07-08 MED ORDER — MIDAZOLAM HCL 5 MG/5ML IJ SOLN
INTRAMUSCULAR | Status: DC | PRN
  Administered 2014-07-08: 2 mg via INTRAVENOUS

## 2014-07-08 MED ORDER — OXYCODONE-ACETAMINOPHEN 5-325 MG PO TABS: 1.0000 | ORAL_TABLET | ORAL | Status: DC | PRN

## 2014-07-08 MED ORDER — SODIUM CHLORIDE 0.9 % IR SOLN
Status: DC | PRN
  Administered 2014-07-08: 4000 mL

## 2014-07-08 MED ORDER — ONDANSETRON HCL 4 MG/2ML IJ SOLN
INTRAMUSCULAR | Status: DC | PRN
  Administered 2014-07-08: 4 mg via INTRAVENOUS

## 2014-07-08 MED ORDER — FENTANYL CITRATE 0.05 MG/ML IJ SOLN
INTRAMUSCULAR | Status: DC | PRN
  Administered 2014-07-08 (×2): 50 ug via INTRAVENOUS

## 2014-07-08 MED ORDER — URELLE 81 MG PO TABS: 1.0000 | ORAL_TABLET | Freq: Four times a day (QID) | ORAL | Status: DC | PRN

## 2014-07-08 MED ORDER — LACTATED RINGERS IV SOLN
INTRAVENOUS | Status: DC
  Administered 2014-07-08 (×2): via INTRAVENOUS
  Filled 2014-07-08: qty 1000

## 2014-07-08 MED ORDER — DEXAMETHASONE SODIUM PHOSPHATE 4 MG/ML IJ SOLN
INTRAMUSCULAR | Status: DC | PRN
  Administered 2014-07-08: 10 mg via INTRAVENOUS

## 2014-07-08 MED ORDER — FENTANYL CITRATE 0.05 MG/ML IJ SOLN
INTRAMUSCULAR | Status: AC
  Filled 2014-07-08: qty 4

## 2014-07-08 MED ORDER — LIDOCAINE HCL (CARDIAC) 20 MG/ML IV SOLN
INTRAVENOUS | Status: DC | PRN
  Administered 2014-07-08: 100 mg via INTRAVENOUS

## 2014-07-08 MED ORDER — LACTATED RINGERS IV SOLN
INTRAVENOUS | Status: DC
  Filled 2014-07-08: qty 1000

## 2014-07-08 MED ORDER — PROPOFOL 10 MG/ML IV BOLUS
INTRAVENOUS | Status: DC | PRN
  Administered 2014-07-08: 200 mg via INTRAVENOUS

## 2014-07-08 MED ORDER — KETOROLAC TROMETHAMINE 30 MG/ML IJ SOLN
INTRAMUSCULAR | Status: DC | PRN
  Administered 2014-07-08: 30 mg via INTRAVENOUS

## 2014-07-08 MED ORDER — IOHEXOL 350 MG/ML SOLN
INTRAVENOUS | Status: DC | PRN
  Administered 2014-07-08: 16 mL

## 2014-07-08 SURGICAL SUPPLY — 40 items
ADAPTER CATH URET PLST 4-6FR (CATHETERS) IMPLANT
ADPR CATH URET STRL DISP 4-6FR (CATHETERS)
BAG DRAIN URO-CYSTO SKYTR STRL (DRAIN) ×4 IMPLANT
BAG DRN UROCATH (DRAIN) ×2
BASKET LASER NITINOL 1.9FR (BASKET) IMPLANT
BASKET STNLS GEMINI 4WIRE 3FR (BASKET) IMPLANT
BASKET ZERO TIP NITINOL 2.4FR (BASKET) IMPLANT
BSKT STON RTRVL 120 1.9FR (BASKET)
BSKT STON RTRVL GEM 120X11 3FR (BASKET)
BSKT STON RTRVL ZERO TP 2.4FR (BASKET)
CANISTER SUCT LVC 12 LTR MEDI- (MISCELLANEOUS) ×4 IMPLANT
CATH INTERMIT  6FR 70CM (CATHETERS) ×4 IMPLANT
CATH URET 5FR 28IN CONE TIP (BALLOONS) ×2
CATH URET 5FR 28IN OPEN ENDED (CATHETERS) IMPLANT
CATH URET 5FR 70CM CONE TIP (BALLOONS) ×2 IMPLANT
CLOTH BEACON ORANGE TIMEOUT ST (SAFETY) ×4 IMPLANT
DRAPE CAMERA CLOSED 9X96 (DRAPES) ×4 IMPLANT
ELECT REM PT RETURN 9FT ADLT (ELECTROSURGICAL)
ELECTRODE REM PT RTRN 9FT ADLT (ELECTROSURGICAL) IMPLANT
FIBER LASER FLEXIVA 200 (UROLOGICAL SUPPLIES) IMPLANT
FIBER LASER FLEXIVA 365 (UROLOGICAL SUPPLIES) IMPLANT
GLOVE BIO SURGEON STRL SZ7.5 (GLOVE) ×4 IMPLANT
GOWN PREVENTION PLUS LG XLONG (DISPOSABLE) ×4 IMPLANT
GOWN STRL REIN XL XLG (GOWN DISPOSABLE) ×4 IMPLANT
GOWN STRL REUS W/TWL XL LVL3 (GOWN DISPOSABLE) ×4 IMPLANT
GUIDEWIRE 0.038 PTFE COATED (WIRE) ×4 IMPLANT
GUIDEWIRE ANG ZIPWIRE 038X150 (WIRE) ×4 IMPLANT
GUIDEWIRE STR DUAL SENSOR (WIRE) ×4 IMPLANT
IV NS IRRIG 3000ML ARTHROMATIC (IV SOLUTION) ×4 IMPLANT
KIT BALLIN UROMAX 15FX10 (LABEL) IMPLANT
KIT BALLN UROMAX 15FX4 (MISCELLANEOUS) IMPLANT
KIT BALLN UROMAX 26 75X4 (MISCELLANEOUS)
PACK CYSTO (CUSTOM PROCEDURE TRAY) ×4 IMPLANT
SET HIGH PRES BAL DIL (LABEL)
SHEATH ACCESS URETERAL 38CM (SHEATH) ×4 IMPLANT
SHEATH ACCESS URETERAL 54CM (SHEATH) IMPLANT
SHEATH URET ACCESS 12FR/35CM (UROLOGICAL SUPPLIES) IMPLANT
SHEATH URET ACCESS 12FR/55CM (UROLOGICAL SUPPLIES) IMPLANT
STENT URET 6FRX26 CONTOUR (STENTS) ×4 IMPLANT
SYRINGE IRR TOOMEY STRL 70CC (SYRINGE) IMPLANT

## 2014-07-08 NOTE — Op Note (Signed)
Preoperative diagnosis: Nephrolithiasis, left hydronephrosis Postoperative diagnosis: Nephrolithiasis, right calyceal diverticulum  Procedure: Exam under anesthesia Cystoscopy Left retrograde pyelogram Right retrograde gram Right ureteral stent placement  Surgeon: Junious Silk  Anesthesia: Gen.  Indication for procedure: Steve Howell is a 58 year old male with nephrolithiasis. He presented earlier in the year with ureteral colic on the left but CT revealed no stone in the left ureter but some mild hydronephrosis. On the right he has a growing right lower pole stone that appears irregular. Evaluation for possible lithotripsy versus biopsy if neoplastic was elected.  Findings: On exam under anesthesia the penis was normal without mass or lesion. Circumcised. The testicles were descended bilaterally and palpably normal. On digital rectal exam the prostate was palpably normal and smooth apart from a very pinpoint hard calcification near the right edge of the prostate. All landmarks were preserved.  On cystoscopy the urethra is normal except for a soft stricture of about 10 French in the bulbar urethra close to the membranous urethra. This was dilated with the scope. The prostate appeared normal apart from some calcifications in the mucosa. The bladder was normal without foreign body or stone. The trigone and ureteral orifices were in their normal orthotopic position with clear efflux. The bladder mucosa was normal.  Left retrograde pyelogram-outlined a single ureter single collecting system unit. There was no filling defect, stricture or dilation.  Right retrograde pyelogram-outlined a single ureter single collecting system unit. There was no filling defect stricture dilation. Off the right lower pole in the region of the stones which were visible on scout this area filled with contrast consistent with a calyceal diverticulum.  Description of procedure: After consent was obtained patient brought to the  operating room. After adequate anesthesia he was placed in lithotomy position and prepped and draped in usual sterile fashion. A timeout was performed to confirm the patient and procedure. An exam under anesthesia was performed. Cystoscope was passed per urethra and the bladder examined. A cone-tipped catheter was used to obtain a left retrograde pyelogram with left retrograde injection of contrast. Cone-tipped catheter was used to obtain a right retrograde pyelogram with retrograde injection of contrast. A sensor wire was then advanced up the right ureter and coiled in the collecting system. Over the sensor wire I tried to pass a dual-lumen exchange catheter which would not advance over the iliacs. A Glidewire was advanced. The dual-lumen and sensor wire removed. Over the Glidewire tried to pass the inner cannula of the access sheath but it would not progress much past the distal ureter. Therefore the decision was made to place a stent. The Glidewire was backloaded on the cystoscope and a 6 x 26 cm stent was advanced. The wire was removed with a good coil seen in the collecting system and a good coil in the bladder. The scope was removed after the bladder was drained. The patient was awakened taken to recovery in stable condition.  Complications: None Blood loss: Minimal Specimens: None  Drains: 6 x 26 cm right ureteral stent  Disposition: Patient stable to PACU

## 2014-07-08 NOTE — Transfer of Care (Signed)
Immediate Anesthesia Transfer of Care Note  Patient: Steve Howell  Procedure(s) Performed: Procedure(s) (LRB): CYSTOSCOPY WITH BILATERAL RETROGRADE PYELOGRAM, right STENT PLACEMENT (Bilateral)  Patient Location: PACU  Anesthesia Type: General  Level of Consciousness: awake, sedated, patient cooperative and responds to stimulation  Airway & Oxygen Therapy: Patient Spontanous Breathing and Patient connected to face mask oxygen  Post-op Assessment: Report given to PACU RN, Post -op Vital signs reviewed and stable and Patient moving all extremities  Post vital signs: Reviewed and stable  Complications: No apparent anesthesia complications

## 2014-07-08 NOTE — Anesthesia Procedure Notes (Signed)
Procedure Name: LMA Insertion Date/Time: 07/08/2014 7:43 AM Performed by: Mechele Claude Pre-anesthesia Checklist: Patient identified, Emergency Drugs available, Suction available and Patient being monitored Patient Re-evaluated:Patient Re-evaluated prior to inductionOxygen Delivery Method: Circle System Utilized Preoxygenation: Pre-oxygenation with 100% oxygen Intubation Type: IV induction Ventilation: Mask ventilation without difficulty LMA: LMA inserted LMA Size: 4.0 Number of attempts: 1 Airway Equipment and Method: bite block Placement Confirmation: positive ETCO2 Tube secured with: Tape Dental Injury: Teeth and Oropharynx as per pre-operative assessment

## 2014-07-08 NOTE — Anesthesia Postprocedure Evaluation (Signed)
  Anesthesia Post-op Note  Patient: Steve Howell  Procedure(s) Performed: Procedure(s) (LRB): CYSTOSCOPY WITH BILATERAL RETROGRADE PYELOGRAM, right STENT PLACEMENT (Bilateral)  Patient Location: PACU  Anesthesia Type: General  Level of Consciousness: awake and alert   Airway and Oxygen Therapy: Patient Spontanous Breathing  Post-op Pain: mild  Post-op Assessment: Post-op Vital signs reviewed, Patient's Cardiovascular Status Stable, Respiratory Function Stable, Patent Airway and No signs of Nausea or vomiting  Last Vitals:  Filed Vitals:   07/08/14 0840  BP: 144/86  Pulse: 81  Temp: 36.5 C  Resp: 14    Post-op Vital Signs: stable   Complications: No apparent anesthesia complications

## 2014-07-08 NOTE — Discharge Instructions (Signed)
Ureteral Stent Implantation, Care After °Refer to this sheet in the next few weeks. These instructions provide you with information on caring for yourself after your procedure. Your health care provider may also give you more specific instructions. Your treatment has been planned according to current medical practices, but problems sometimes occur. Call your health care provider if you have any problems or questions after your procedure. °WHAT TO EXPECT AFTER THE PROCEDURE °You should be back to normal activity within 48 hours after the procedure. Nausea and vomiting may occur and are commonly the result of anesthesia. °It is common to experience sharp pain in the back or lower abdomen and penis with voiding. This is caused by movement of the ends of the stent with the act of urinating. It usually goes away within minutes after you have stopped urinating. °HOME CARE INSTRUCTIONS °Make sure to drink plenty of fluids. You may have small amounts of bleeding, causing your urine to be red. This is normal. Certain movements may trigger pain or a feeling that you need to urinate. You may be given medicines to prevent infection or bladder spasms. Be sure to take all medicines as directed. Only take over-the-counter or prescription medicines for pain, discomfort, or fever as directed by your health care provider. Do not take aspirin, as this can make bleeding worse. °Your stent will be left in until the blockage is resolved. This may take 2 weeks or longer, depending on the reason for stent implantation. You may have an X-ray exam to make sure your ureter is open and that the stent has not moved out of position (migrated). The stent can be removed by your health care provider in the office. Medicines may be given for comfort while the stent is being removed. Be sure to keep all follow-up appointments so your health care provider can check that you are healing properly. °SEEK MEDICAL CARE IF: °· You experience increasing  pain. °· Your pain medicine is not working. °SEEK IMMEDIATE MEDICAL CARE IF: °· Your urine is dark red or has blood clots. °· You are leaking urine (incontinent). °· You have a fever, chills, feeling sick to your stomach (nausea), or vomiting. °· Your pain is not relieved by pain medicine. °· The end of the stent comes out of the urethra. °· You are unable to urinate. °Document Released: 04/28/2013 Document Revised: 08/31/2013 Document Reviewed: 04/28/2013 °[employer]® Patient Information ©2015 [employer]. This information is not intended to replace advice given to you by your health care provider. Make sure you discuss any questions you have with your health care provider. ° ° ° °Post Anesthesia Home Care Instructions ° °Activity: °Get plenty of rest for the remainder of the day. A responsible adult should stay with you for 24 hours following the procedure.  °For the next 24 hours, DO NOT: °-Drive a car °-Operate machinery °-Drink alcoholic beverages °-Take any medication unless instructed by your physician °-Make any legal decisions or sign important papers. ° °Meals: °Start with liquid foods such as gelatin or soup. Progress to regular foods as tolerated. Avoid greasy, spicy, heavy foods. If nausea and/or vomiting occur, drink only clear liquids until the nausea and/or vomiting subsides. Call your physician if vomiting continues. ° °Special Instructions/Symptoms: °Your throat may feel dry or sore from the anesthesia or the breathing tube placed in your throat during surgery. If this causes discomfort, gargle with warm salt water. The discomfort should disappear within 24 hours. ° °

## 2014-07-08 NOTE — H&P (Signed)
H&P   History of Present Illness: Patient with h/o stones. He had left renal colic and mild HUN but no stone on CT Aug 2015. F/u renal U/S showed no left hydro. He also has a complex RLP stone/papilla that's difficult to tell if the stone is in the collecting system or not. Also it is irregular and we want to rule out neoplasm.   Today, he is well. He's had no flank pain, dysuria, fever or stone passage.   Past Medical History  Diagnosis Date  . OSA on CPAP   . Right nephrolithiasis   . Hydronephrosis, left   . Borderline glaucoma     RIGHT EYE  . GERD (gastroesophageal reflux disease)   . History of kidney stones   . Wears glasses    Past Surgical History  Procedure Laterality Date  . Lumbar disc surgery  2000    L4-5  . Retinal detachment surgery  04-04-2014  . Cataract extraction w/ intraocular lens implant Right 2015    Home Medications:  Prescriptions prior to admission  Medication Sig Dispense Refill  . Bromfenac Sodium (PROLENSA) 0.07 % SOLN Place 1 drop into the right eye every evening.      . timolol (BETIMOL) 0.5 % ophthalmic solution Place 1 drop into the right eye 2 (two) times daily.       Allergies:  Allergies  Allergen Reactions  . Diamox [Acetazolamide] Other (See Comments)    CAUSED RENAL COLIC  . Simbrinza [Brinzolamide-Brimonidine] Rash    History reviewed. No pertinent family history. Social History:  reports that he quit smoking about 20 years ago. His smoking use included Cigarettes. He has a 20 pack-year smoking history. He has never used smokeless tobacco. He reports that he drinks about .6 ounces of alcohol per week. He reports that he does not use illicit drugs.  ROS: A complete review of systems was performed.  All systems are negative except for pertinent findings as noted. Review of Systems  All other systems reviewed and are negative.    Physical Exam:  Vital signs in last 24 hours: Temp:  [98.5 F (36.9 C)] 98.5 F (36.9 C) (10/30  0637) Pulse Rate:  [74] 74 (10/30 0637) Resp:  [16] 16 (10/30 0637) BP: (131)/(75) 131/75 mmHg (10/30 0637) SpO2:  [97 %] 97 % (10/30 0637) Weight:  [93.214 kg (205 lb 8 oz)] 93.214 kg (205 lb 8 oz) (10/30 6734) General:  Alert and oriented, No acute distress HEENT: Normocephalic, atraumatic Neck: No JVD or lymphadenopathy Cardiovascular: Regular rate and rhythm Lungs: Regular rate and effort Abdomen: Soft, nontender, nondistended, no abdominal masses Back: No CVA tenderness Extremities: No edema Neurologic: Grossly intact  Laboratory Data:  Results for orders placed during the hospital encounter of 07/08/14 (from the past 24 hour(s))  POCT HEMOGLOBIN-HEMACUE     Status: None   Collection Time    07/08/14  7:15 AM      Result Value Ref Range   Hemoglobin 15.9  13.0 - 17.0 g/dL   No results found for this or any previous visit (from the past 240 hour(s)). Creatinine: No results found for this basename: CREATININE,  in the last 168 hours  Impression/Assessment/plan: Right lower pole stone, prior left hydronephrosis, nephrolithiasis - I discussed with the patient the nature, potential benefits, risks and alternatives to cystoscopy, bilateral RGP, right URS, HLL, bx and/or stent including side effects of the proposed treatment, the likelihood of the patient achieving the goals of the procedure, and any potential problems  that might occur during the procedure or recuperation. Discussed stent pain, ureteral injury, need for staged procedure among others. All questions answered. Patient elects to proceed.      Festus Aloe 07/08/2014, 7:34 AM

## 2014-07-11 ENCOUNTER — Other Ambulatory Visit: Payer: Self-pay | Admitting: Urology

## 2014-07-25 ENCOUNTER — Encounter (HOSPITAL_BASED_OUTPATIENT_CLINIC_OR_DEPARTMENT_OTHER): Payer: Self-pay | Admitting: *Deleted

## 2014-07-25 NOTE — Progress Notes (Signed)
NPO AFTER MN. ARRIVE AT 1962. CURRENT HG IN CHART AND EPIC. MAY TAKE TYLENOL/ OXYCODONE IF NEEDED W/ SIPS OF WATER AM DOS.

## 2014-07-29 ENCOUNTER — Ambulatory Visit (HOSPITAL_BASED_OUTPATIENT_CLINIC_OR_DEPARTMENT_OTHER)
Admission: RE | Admit: 2014-07-29 | Discharge: 2014-07-29 | Disposition: A | Discharge status: Home / Self Care | Payer: BC Managed Care – PPO | Source: Ambulatory Visit | Attending: Urology | Admitting: Urology

## 2014-07-29 ENCOUNTER — Encounter (HOSPITAL_BASED_OUTPATIENT_CLINIC_OR_DEPARTMENT_OTHER)
Admission: RE | Disposition: A | Discharge status: Home / Self Care | Payer: Self-pay | Source: Ambulatory Visit | Attending: Urology

## 2014-07-29 ENCOUNTER — Ambulatory Visit (HOSPITAL_BASED_OUTPATIENT_CLINIC_OR_DEPARTMENT_OTHER): Payer: BC Managed Care – PPO | Admitting: Anesthesiology

## 2014-07-29 ENCOUNTER — Encounter (HOSPITAL_BASED_OUTPATIENT_CLINIC_OR_DEPARTMENT_OTHER): Payer: Self-pay | Admitting: Anesthesiology

## 2014-07-29 DIAGNOSIS — N2889 Other specified disorders of kidney and ureter: Secondary | ICD-10-CM | POA: Insufficient documentation

## 2014-07-29 DIAGNOSIS — Z87891 Personal history of nicotine dependence: Secondary | ICD-10-CM | POA: Insufficient documentation

## 2014-07-29 DIAGNOSIS — N200 Calculus of kidney: Secondary | ICD-10-CM | POA: Diagnosis present

## 2014-07-29 DIAGNOSIS — K219 Gastro-esophageal reflux disease without esophagitis: Secondary | ICD-10-CM | POA: Diagnosis not present

## 2014-07-29 DIAGNOSIS — H40001 Preglaucoma, unspecified, right eye: Secondary | ICD-10-CM | POA: Diagnosis not present

## 2014-07-29 DIAGNOSIS — G4733 Obstructive sleep apnea (adult) (pediatric): Secondary | ICD-10-CM | POA: Insufficient documentation

## 2014-07-29 HISTORY — PX: CYSTOSCOPY WITH URETEROSCOPY AND STENT PLACEMENT: SHX6377

## 2014-07-29 HISTORY — PX: HOLMIUM LASER APPLICATION: SHX5852

## 2014-07-29 SURGERY — CYSTOURETEROSCOPY, WITH STENT INSERTION
Anesthesia: General | Site: Ureter | Laterality: Right

## 2014-07-29 MED ORDER — MIDAZOLAM HCL 2 MG/2ML IJ SOLN
INTRAMUSCULAR | Status: AC
  Filled 2014-07-29: qty 2

## 2014-07-29 MED ORDER — OXYCODONE HCL 5 MG PO TABS
5.0000 mg | ORAL_TABLET | Freq: Once | ORAL | Status: DC | PRN
  Filled 2014-07-29: qty 1

## 2014-07-29 MED ORDER — FENTANYL CITRATE 0.05 MG/ML IJ SOLN
INTRAMUSCULAR | Status: DC | PRN
  Administered 2014-07-29 (×2): 50 ug via INTRAVENOUS
  Administered 2014-07-29: 75 ug via INTRAVENOUS

## 2014-07-29 MED ORDER — DEXAMETHASONE SODIUM PHOSPHATE 4 MG/ML IJ SOLN
INTRAMUSCULAR | Status: DC | PRN
  Administered 2014-07-29: 10 mg via INTRAVENOUS

## 2014-07-29 MED ORDER — ACETAMINOPHEN 10 MG/ML IV SOLN
INTRAVENOUS | Status: DC | PRN
  Administered 2014-07-29: 1000 mg via INTRAVENOUS

## 2014-07-29 MED ORDER — KETOROLAC TROMETHAMINE 30 MG/ML IJ SOLN
INTRAMUSCULAR | Status: DC | PRN
  Administered 2014-07-29: 30 mg via INTRAVENOUS

## 2014-07-29 MED ORDER — HYDROMORPHONE HCL 1 MG/ML IJ SOLN
0.2500 mg | INTRAMUSCULAR | Status: DC | PRN
  Filled 2014-07-29: qty 1

## 2014-07-29 MED ORDER — OXYCODONE HCL 5 MG/5ML PO SOLN
5.0000 mg | Freq: Once | ORAL | Status: DC | PRN
  Filled 2014-07-29: qty 5

## 2014-07-29 MED ORDER — PROPOFOL 10 MG/ML IV BOLUS
INTRAVENOUS | Status: DC | PRN
  Administered 2014-07-29: 200 mg via INTRAVENOUS
  Administered 2014-07-29: 50 mg via INTRAVENOUS

## 2014-07-29 MED ORDER — ONDANSETRON HCL 4 MG/2ML IJ SOLN
INTRAMUSCULAR | Status: DC | PRN
  Administered 2014-07-29: 4 mg via INTRAVENOUS

## 2014-07-29 MED ORDER — FENTANYL CITRATE 0.05 MG/ML IJ SOLN
INTRAMUSCULAR | Status: AC
  Filled 2014-07-29: qty 4

## 2014-07-29 MED ORDER — LIDOCAINE HCL 2 % EX GEL
CUTANEOUS | Status: DC | PRN
  Administered 2014-07-29: 1

## 2014-07-29 MED ORDER — CEFAZOLIN SODIUM-DEXTROSE 2-3 GM-% IV SOLR
INTRAVENOUS | Status: AC
  Filled 2014-07-29: qty 50

## 2014-07-29 MED ORDER — SODIUM CHLORIDE 0.9 % IR SOLN
Status: DC | PRN
  Administered 2014-07-29: 6000 mL

## 2014-07-29 MED ORDER — MIDAZOLAM HCL 5 MG/5ML IJ SOLN
INTRAMUSCULAR | Status: DC | PRN
  Administered 2014-07-29: 2 mg via INTRAVENOUS

## 2014-07-29 MED ORDER — OXYCODONE-ACETAMINOPHEN 5-325 MG PO TABS: 1.0000 | ORAL_TABLET | Freq: Four times a day (QID) | ORAL | Status: DC | PRN

## 2014-07-29 MED ORDER — CEPHALEXIN 500 MG PO CAPS: 500.0000 mg | ORAL_CAPSULE | Freq: Two times a day (BID) | ORAL | Status: DC

## 2014-07-29 MED ORDER — LACTATED RINGERS IV SOLN
INTRAVENOUS | Status: DC
  Administered 2014-07-29 (×2): via INTRAVENOUS
  Filled 2014-07-29: qty 1000

## 2014-07-29 MED ORDER — CEFAZOLIN SODIUM-DEXTROSE 2-3 GM-% IV SOLR
2.0000 g | INTRAVENOUS | Status: AC
  Administered 2014-07-29: 2 g via INTRAVENOUS
  Filled 2014-07-29: qty 50

## 2014-07-29 MED ORDER — 0.9 % SODIUM CHLORIDE (POUR BTL) OPTIME
TOPICAL | Status: DC | PRN
  Administered 2014-07-29: 500 mL

## 2014-07-29 MED ORDER — LIDOCAINE HCL (CARDIAC) 20 MG/ML IV SOLN
INTRAVENOUS | Status: DC | PRN
  Administered 2014-07-29: 80 mg via INTRAVENOUS

## 2014-07-29 MED ORDER — CEFAZOLIN SODIUM 1-5 GM-% IV SOLN
1.0000 g | INTRAVENOUS | Status: DC
  Filled 2014-07-29: qty 50

## 2014-07-29 SURGICAL SUPPLY — 44 items
ADAPTER CATH URET PLST 4-6FR (CATHETERS) IMPLANT
ADPR CATH URET STRL DISP 4-6FR (CATHETERS)
BAG DRAIN URO-CYSTO SKYTR STRL (DRAIN) ×3 IMPLANT
BAG DRN UROCATH (DRAIN) ×1
BASKET LASER NITINOL 1.9FR (BASKET) ×3 IMPLANT
BASKET STNLS GEMINI 4WIRE 3FR (BASKET) IMPLANT
BASKET ZERO TIP NITINOL 2.4FR (BASKET) IMPLANT
BSKT STON RTRVL 120 1.9FR (BASKET) ×1
BSKT STON RTRVL GEM 120X11 3FR (BASKET)
BSKT STON RTRVL ZERO TP 2.4FR (BASKET)
CANISTER SUCT LVC 12 LTR MEDI- (MISCELLANEOUS) ×3 IMPLANT
CATH INTERMIT  6FR 70CM (CATHETERS) IMPLANT
CATH URET 5FR 28IN CONE TIP (BALLOONS)
CATH URET 5FR 28IN OPEN ENDED (CATHETERS) IMPLANT
CATH URET 5FR 70CM CONE TIP (BALLOONS) IMPLANT
CATH URET DUAL LUMEN 6-10FR 50 (CATHETERS) ×6 IMPLANT
CLOTH BEACON ORANGE TIMEOUT ST (SAFETY) ×3 IMPLANT
DRAPE CAMERA CLOSED 9X96 (DRAPES) ×3 IMPLANT
ELECT REM PT RETURN 9FT ADLT (ELECTROSURGICAL) ×3
ELECTRODE REM PT RTRN 9FT ADLT (ELECTROSURGICAL) ×1 IMPLANT
FIBER LASER TRAC TIP (UROLOGICAL SUPPLIES) ×3 IMPLANT
GLOVE BIO SURGEON STRL SZ7.5 (GLOVE) ×3 IMPLANT
GLOVE BIOGEL PI IND STRL 6.5 (GLOVE) ×1 IMPLANT
GLOVE BIOGEL PI IND STRL 7.5 (GLOVE) ×1 IMPLANT
GLOVE BIOGEL PI INDICATOR 6.5 (GLOVE) ×2
GLOVE BIOGEL PI INDICATOR 7.5 (GLOVE) ×2
GLOVE SURG SS PI 7.5 STRL IVOR (GLOVE) ×3 IMPLANT
GOWN STRL REUS W/ TWL XL LVL3 (GOWN DISPOSABLE) ×2 IMPLANT
GOWN STRL REUS W/TWL XL LVL3 (GOWN DISPOSABLE) ×12 IMPLANT
GUIDEWIRE 0.038 PTFE COATED (WIRE) IMPLANT
GUIDEWIRE ANG ZIPWIRE 038X150 (WIRE) IMPLANT
GUIDEWIRE STR DUAL SENSOR (WIRE) ×3 IMPLANT
IV NS IRRIG 3000ML ARTHROMATIC (IV SOLUTION) ×6 IMPLANT
KIT BALLIN UROMAX 15FX10 (LABEL) IMPLANT
KIT BALLN UROMAX 15FX4 (MISCELLANEOUS) IMPLANT
KIT BALLN UROMAX 26 75X4 (MISCELLANEOUS)
NS IRRIG 1000ML POUR BTL (IV SOLUTION) ×3 IMPLANT
PACK CYSTO (CUSTOM PROCEDURE TRAY) ×3 IMPLANT
SET HIGH PRES BAL DIL (LABEL)
SHEATH ACCESS URETERAL 38CM (SHEATH) ×3 IMPLANT
SHEATH URET ACCESS 12FR/35CM (UROLOGICAL SUPPLIES) IMPLANT
SHEATH URET ACCESS 12FR/55CM (UROLOGICAL SUPPLIES) IMPLANT
STENT URET 6FRX26 CONTOUR (STENTS) ×3 IMPLANT
SYRINGE IRR TOOMEY STRL 70CC (SYRINGE) IMPLANT

## 2014-07-29 NOTE — Discharge Instructions (Signed)
Ureteral Stent Implantation, Care After Refer to this sheet in the next few weeks. These instructions provide you with information on caring for yourself after your procedure. Your health care provider may also give you more specific instructions. Your treatment has been planned according to current medical practices, but problems sometimes occur. Call your health care provider if you have any problems or questions after your procedure. WHAT TO EXPECT AFTER THE PROCEDURE You should be back to normal activity within 48 hours after the procedure. Nausea and vomiting may occur and are commonly the result of anesthesia. It is common to experience sharp pain in the back or lower abdomen and penis with voiding. This is caused by movement of the ends of the stent with the act of urinating.It usually goes away within minutes after you have stopped urinating. HOME CARE INSTRUCTIONS Make sure to drink plenty of fluids. You may have small amounts of bleeding, causing your urine to be red. This is normal. Certain movements may trigger pain or a feeling that you need to urinate. You may be given medicines to prevent infection or bladder spasms. Be sure to take all medicines as directed. Only take over-the-counter or prescription medicines for pain, discomfort, or fever as directed by your health care provider. Do not take aspirin, as this can make bleeding worse.  REMOVAL OF THE STENT: Remove the stent, next Tuesday, 08/02/2014, in the morning by pulling the string with slow steady pressure until the entire stent is removed.  SEEK MEDICAL CARE IF:  You experience increasing pain.  Your pain medicine is not working. SEEK IMMEDIATE MEDICAL CARE IF:  Your urine is dark red or has blood clots.  You are leaking urine (incontinent).  You have a fever, chills, feeling sick to your stomach (nausea), or vomiting.  Your pain is not relieved by pain medicine.  The end of the stent comes out of the urethra.  You  are unable to urinate. Document Released: 04/28/2013 Document Revised: 08/31/2013 Document Reviewed: 04/28/2013 [facility] Patient Information 2015 [place], [place]. This information is not intended to replace advice given to you by your health care provider. Make sure you discuss any questions you have with your health care provider.      Post Anesthesia Home Care Instructions  Activity: Get plenty of rest for the remainder of the day. A responsible adult should stay with you for 24 hours following the procedure.  For the next 24 hours, DO NOT: -Drive a car -Paediatric nurse -Drink alcoholic beverages -Take any medication unless instructed by your physician -Make any legal decisions or sign important papers.  Meals: Start with liquid foods such as gelatin or soup. Progress to regular foods as tolerated. Avoid greasy, spicy, heavy foods. If nausea and/or vomiting occur, drink only clear liquids until the nausea and/or vomiting subsides. Call your physician if vomiting continues.  Special Instructions/Symptoms: Your throat may feel dry or sore from the anesthesia or the breathing tube placed in your throat during surgery. If this causes discomfort, gargle with warm salt water. The discomfort should disappear within 24 hours.

## 2014-07-29 NOTE — Transfer of Care (Signed)
Immediate Anesthesia Transfer of Care Note  Patient: Steve Howell  Procedure(s) Performed: Procedure(s) (LRB): CYSTO  WITH RIGHT URETEROSCOPY/LITHOTRIPSY/STENT rePLACEMENT (Right) HOLMIUM LASER APPLICATION (Right)  Patient Location: PACU  Anesthesia Type: General  Level of Consciousness: awake, oriented, sedated and patient cooperative  Airway & Oxygen Therapy: Patient Spontanous Breathing and Patient connected to face mask oxygen  Post-op Assessment: Report given to PACU RN and Post -op Vital signs reviewed and stable  Post vital signs: Reviewed and stable  Complications: No apparent anesthesia complications

## 2014-07-29 NOTE — Interval H&P Note (Signed)
History and Physical Interval Note:  07/29/2014 9:12 AM  Steve Howell  has presented today for surgery, with the diagnosis of RIGHT RENAL STONES  The various methods of treatment have been discussed with the patient and family. After consideration of risks, benefits and other options for treatment, the patient has consented to  Procedure(s): CYSTO  WITH RIGHT URETEROSCOPY/LITHOTRIPSY/STENT PLACEMENT (Right) HOLMIUM LASER APPLICATION (Right) as a surgical intervention .  The patient's history has been reviewed, patient examined, no change in status, stable for surgery.  I have reviewed the patient's chart and labs.  Questions were answered to the patient's satisfaction.  Patient is well. He has minimal stent discomfort at rest. 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 If we have to place a new stent has to be written out of work until the stent is removed. 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 I discussed with the patient and his family the implications of a calyceal diverticulum, failure to access the diverticulum, balloon dilation of the diverticular neck with stone removal, recurrence of stenotic diverticular neck, other approaches such as percutaneous access. All questions answered. His main goal would be to secure a diagnosis and get stent free so that he can get back to work. Otherwise he's been well without fevers or dysuria. He looks well today. We discussed the nature risk and benefits of cystoscopy with possible balloon dilation of the diverticular neck, holmium laser lithotripsy stone basket extraction. We discussed side effects of the proposed treatment, the likelihood of the patient achieving the goals of the procedure, and any potential problems that might occur during the procedure or recuperation. All questions answered. Patient elects to  proceed.    Festus Aloe

## 2014-07-29 NOTE — Op Note (Signed)
Preoperative diagnosis: Right nephrolithiasis, calyceal diverticulum Postoperative diagnosis: Right nephrolithiasis, calyceal diverticulum  Procedure: Cystoscopy, laser and Bugbee incision of calyceal diverticulum, Holmium laser lithotripsy, stone basket extraction, Right ureteral stent exchange  Surgeon: Junious Silk  Anesthesia: Fitzgerald  Type of anesthesia: Gen.  Findings: Multichannel right lower pole calyceal diverticulum. One section was opened and I would estimate about 50% of the stone burden removed. Other stone was visible on fluoroscopy but I could not access it retrograde.  Indication for procedure: Steve Howell is a 58 year old who has been undergoing surveillance of right lower pole stones which have increased in size over the past year. 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 The ureter would not allow retrograde access therefore a stent was placed and he was brought back today and staged fashion to attempt access the diverticulum and removed the stones.  Description of procedure: After consent was obtained patient brought to the operating room. After adequate anesthesia he is placed in lithotomy position and prepped and draped in the usual sterile fashion. A timeout was performed to confirm the patient and procedure. A cystoscope was passed per urethra and the stent grasped and removed through the urethral meatus. A sensor wire was advanced and cold in the collecting system and the stent removed. Over the sensor wire dual-lumen exchange catheter was advanced and a second wire was placed without difficulty. The dual-lumen was removed and a 38 cm ureteral access sheath was advanced.  The ureteroscope was advanced in the collecting system visualized there was no tumor or stone. In the lower pole off of the calyx was a multichannel diverticulum with stone visible under the mucosa. A 200  laser fiber was deployed and I opened up several the  diverticular channels and connected the spaces which released to small stones. These were grasped and removed with an escape basket. The holmium was deployed again and another adjacent diverticulum incised and opened which revealed a larger stone. This was treated with the holmium to knock off the edges which allowed the stone to release. It was then grasped with an escape basket and removed. On fluoroscopy I estimate about 50% of the stone burden was removed but I could still see some stone underneath the mucosa. The edge of the mucosa was using from the prior holmium incisions therefore I deployed the ureteroscopic Bugbee and coagulated the small mucosal bleeders. I then used the Bugbee to incise an open up the diverticulum I had already entered further. There were several other channels which are probed with the Bugbee and the basket could not engage or find any other stone. Given the remaining stone could not be accessed I did not want to do further and deeper blind incisions with the Bugbee therefore I decided to terminate the procedure. The collecting system was checked and no other stones were noted. The ureteroscope was brought down to the edge of the access sheath and the ureteroscope and access sheath brought out together. The ureter appeared normal. There was no stone or injury and the ureter.  The wire was then backloaded on the cystoscope and a 626 cm stent was advanced. The wire was removed with a good coil seen in the kidney and a good coil in the bladder. The scope was removed. I instilled some lidocaine jelly in the urethra. I left a string on the stent. The patient was awakened and taken to the recovery room in stable condition.  Complications: None  Blood loss: Minimal  Specimens:  Stone fragments to office lab  Drains: 6 x 26 cm right ureteral stent  Disposition: Patient stable to PACU

## 2014-07-29 NOTE — H&P (View-Only) (Signed)
H&P   History of Present Illness: Patient with h/o stones. He had left renal colic and mild HUN but no stone on CT Aug 2015. F/u renal U/S showed no left hydro. He also has a complex RLP stone/papilla that's difficult to tell if the stone is in the collecting system or not. Also it is irregular and we want to rule out neoplasm.   Today, he is well. He's had no flank pain, dysuria, fever or stone passage.   Past Medical History  Diagnosis Date  . OSA on CPAP   . Right nephrolithiasis   . Hydronephrosis, left   . Borderline glaucoma     RIGHT EYE  . GERD (gastroesophageal reflux disease)   . History of kidney stones   . Wears glasses    Past Surgical History  Procedure Laterality Date  . Lumbar disc surgery  2000    L4-5  . Retinal detachment surgery  04-04-2014  . Cataract extraction w/ intraocular lens implant Right 2015    Home Medications:  Prescriptions prior to admission  Medication Sig Dispense Refill  . Bromfenac Sodium (PROLENSA) 0.07 % SOLN Place 1 drop into the right eye every evening.      . timolol (BETIMOL) 0.5 % ophthalmic solution Place 1 drop into the right eye 2 (two) times daily.       Allergies:  Allergies  Allergen Reactions  . Diamox [Acetazolamide] Other (See Comments)    CAUSED RENAL COLIC  . Simbrinza [Brinzolamide-Brimonidine] Rash    History reviewed. No pertinent family history. Social History:  reports that he quit smoking about 20 years ago. His smoking use included Cigarettes. He has a 20 pack-year smoking history. He has never used smokeless tobacco. He reports that he drinks about .6 ounces of alcohol per week. He reports that he does not use illicit drugs.  ROS: A complete review of systems was performed.  All systems are negative except for pertinent findings as noted. Review of Systems  All other systems reviewed and are negative.    Physical Exam:  Vital signs in last 24 hours: Temp:  [98.5 F (36.9 C)] 98.5 F (36.9 C) (10/30  0637) Pulse Rate:  [74] 74 (10/30 0637) Resp:  [16] 16 (10/30 0637) BP: (131)/(75) 131/75 mmHg (10/30 0637) SpO2:  [97 %] 97 % (10/30 0637) Weight:  [93.214 kg (205 lb 8 oz)] 93.214 kg (205 lb 8 oz) (10/30 9735) General:  Alert and oriented, No acute distress HEENT: Normocephalic, atraumatic Neck: No JVD or lymphadenopathy Cardiovascular: Regular rate and rhythm Lungs: Regular rate and effort Abdomen: Soft, nontender, nondistended, no abdominal masses Back: No CVA tenderness Extremities: No edema Neurologic: Grossly intact  Laboratory Data:  Results for orders placed during the hospital encounter of 07/08/14 (from the past 24 hour(s))  POCT HEMOGLOBIN-HEMACUE     Status: None   Collection Time    07/08/14  7:15 AM      Result Value Ref Range   Hemoglobin 15.9  13.0 - 17.0 g/dL   No results found for this or any previous visit (from the past 240 hour(s)). Creatinine: No results found for this basename: CREATININE,  in the last 168 hours  Impression/Assessment/plan: Right lower pole stone, prior left hydronephrosis, nephrolithiasis - I discussed with the patient the nature, potential benefits, risks and alternatives to cystoscopy, bilateral RGP, right URS, HLL, bx and/or stent including side effects of the proposed treatment, the likelihood of the patient achieving the goals of the procedure, and any potential problems  that might occur during the procedure or recuperation. Discussed stent pain, ureteral injury, need for staged procedure among others. All questions answered. Patient elects to proceed.      Festus Aloe 07/08/2014, 7:34 AM

## 2014-07-29 NOTE — Anesthesia Postprocedure Evaluation (Signed)
  Anesthesia Post-op Note  Patient: Steve Howell  Procedure(s) Performed: Procedure(s): CYSTO  WITH RIGHT URETEROSCOPY/LITHOTRIPSY/STENT rePLACEMENT (Right) HOLMIUM LASER APPLICATION (Right)  Patient Location: PACU  Anesthesia Type:General  Level of Consciousness: awake and alert   Airway and Oxygen Therapy: Patient Spontanous Breathing  Post-op Pain: none  Post-op Assessment: Post-op Vital signs reviewed, Patient's Cardiovascular Status Stable and Respiratory Function Stable  Post-op Vital Signs: Reviewed  Filed Vitals:   07/29/14 1200  BP: 135/87  Pulse: 73  Temp:   Resp: 12    Complications: No apparent anesthesia complications

## 2014-07-29 NOTE — Anesthesia Preprocedure Evaluation (Addendum)
Anesthesia Evaluation  Patient identified by MRN, date of birth, ID band Patient awake    Reviewed: Allergy & Precautions, H&P , NPO status , Patient's Chart, lab work & pertinent test results  Airway Mallampati: III  TM Distance: <3 FB Neck ROM: Full    Dental no notable dental hx. (+) Teeth Intact, Dental Advisory Given   Pulmonary sleep apnea and Continuous Positive Airway Pressure Ventilation , former smoker,  breath sounds clear to auscultation  Pulmonary exam normal       Cardiovascular Exercise Tolerance: Good negative cardio ROS  Rhythm:Regular Rate:Normal     Neuro/Psych negative neurological ROS  negative psych ROS   GI/Hepatic Neg liver ROS, GERD-  Controlled,  Endo/Other  negative endocrine ROS  Renal/GU Renal disease  negative genitourinary   Musculoskeletal negative musculoskeletal ROS (+)   Abdominal   Peds  Hematology negative hematology ROS (+)   Anesthesia Other Findings   Reproductive/Obstetrics negative OB ROS                        Anesthesia Physical Anesthesia Plan  ASA: III  Anesthesia Plan: General   Post-op Pain Management:    Induction: Intravenous  Airway Management Planned: LMA  Additional Equipment:   Intra-op Plan:   Post-operative Plan: Extubation in OR  Informed Consent: I have reviewed the patients History and Physical, chart, labs and discussed the procedure including the risks, benefits and alternatives for the proposed anesthesia with the patient or authorized representative who has indicated his/her understanding and acceptance.   Dental advisory given  Plan Discussed with: CRNA  Anesthesia Plan Comments:         Anesthesia Quick Evaluation

## 2014-07-29 NOTE — Anesthesia Procedure Notes (Addendum)
Procedure Name: LMA Insertion Date/Time: 07/29/2014 9:19 AM Performed by: Wanita Chamberlain Pre-anesthesia Checklist: Patient identified, Timeout performed, Emergency Drugs available, Suction available and Patient being monitored Patient Re-evaluated:Patient Re-evaluated prior to inductionOxygen Delivery Method: Circle system utilized Preoxygenation: Pre-oxygenation with 100% oxygen Ventilation: Mask ventilation without difficulty LMA: LMA inserted LMA Size: 4.0 Number of attempts: 1 Airway Equipment and Method: Bite block Placement Confirmation: positive ETCO2 Tube secured with: Tape Dental Injury: Teeth and Oropharynx as per pre-operative assessment

## 2014-08-01 ENCOUNTER — Encounter (HOSPITAL_BASED_OUTPATIENT_CLINIC_OR_DEPARTMENT_OTHER): Payer: Self-pay | Admitting: Urology

## 2014-09-07 LAB — PSA

## 2014-10-10 ENCOUNTER — Ambulatory Visit: Payer: Self-pay | Admitting: Ophthalmology

## 2015-06-26 ENCOUNTER — Ambulatory Visit (INDEPENDENT_AMBULATORY_CARE_PROVIDER_SITE_OTHER): Payer: BLUE CROSS/BLUE SHIELD | Admitting: Family Medicine

## 2015-06-26 ENCOUNTER — Encounter: Payer: Self-pay | Admitting: Family Medicine

## 2015-06-26 VITALS — BP 113/77 | HR 66 | Temp 98.0°F | Ht 70.8 in | Wt 207.0 lb

## 2015-06-26 DIAGNOSIS — Z0000 Encounter for general adult medical examination without abnormal findings: Secondary | ICD-10-CM

## 2015-06-26 DIAGNOSIS — N200 Calculus of kidney: Secondary | ICD-10-CM

## 2015-06-26 DIAGNOSIS — I10 Essential (primary) hypertension: Secondary | ICD-10-CM

## 2015-06-26 DIAGNOSIS — J309 Allergic rhinitis, unspecified: Secondary | ICD-10-CM

## 2015-06-26 DIAGNOSIS — G4733 Obstructive sleep apnea (adult) (pediatric): Secondary | ICD-10-CM | POA: Insufficient documentation

## 2015-06-26 LAB — URINALYSIS, ROUTINE W REFLEX MICROSCOPIC
BILIRUBIN UA: NEGATIVE
Glucose, UA: NEGATIVE
Ketones, UA: NEGATIVE
Leukocytes, UA: NEGATIVE
NITRITE UA: NEGATIVE
PH UA: 7 (ref 5.0–7.5)
PROTEIN UA: NEGATIVE
RBC, UA: NEGATIVE
Specific Gravity, UA: 1.02 (ref 1.005–1.030)
Urobilinogen, Ur: 1 mg/dL (ref 0.2–1.0)

## 2015-06-26 MED ORDER — TRAZODONE HCL 50 MG PO TABS: 25.0000 mg | ORAL_TABLET | Freq: Every evening | ORAL | Status: DC | PRN

## 2015-06-26 NOTE — Progress Notes (Signed)
BP 113/77 mmHg  Pulse 66  Temp(Src) 98 F (36.7 C)  Ht 5' 10.8" (1.798 m)  Wt 207 lb (93.895 kg)  BMI 29.04 kg/m2  SpO2 99%   Subjective:    Patient ID: Steve Howell, male    DOB: June 15, 1956, 59 y.o.   MRN: [id number]  HPI: [name] is a 59 y.o. male  Chief Complaint  Patient presents with  . 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 Also had 2 kidney surgeries last year had large kidney stones to 2 surgeries to complete removal.   also this summer had some insomnia and marked fatigue symptoms was taking Tylenol p.m. which helped with sleep and fatigue. Patient still having some sleep issues but on further reading of the labile realize that with his glaucoma he should not take these medications. Was  wondered what meds he can take to help with sleep.   Relevant past medical, surgical, family and social history reviewed and updated as indicated. Interim medical history since our last visit reviewed. Allergies and medications reviewed and updated.  Review of Systems  Constitutional: Negative.   HENT: Negative.   Eyes: Negative.   Respiratory: Negative.   Cardiovascular: Negative.   Gastrointestinal: Negative.   Endocrine: Negative.   Genitourinary: Negative.   Musculoskeletal: Negative.   Skin: Negative.        Wart on right lateral arm wants frozen off which was done with cryo-unit  Allergic/Immunologic: Negative.   Neurological: Negative.   Hematological: Negative.   Psychiatric/Behavioral: Negative.     Per HPI unless specifically indicated above     Objective:    BP 113/77 mmHg  Pulse 66  Temp(Src) 98 F (36.7 C)  Ht 5' 10.8" (1.798 m)  Wt 207 lb (93.895 kg)  BMI 29.04 kg/m2  SpO2 99%  Wt Readings from Last 3 Encounters:  06/26/15 207 lb (93.895 kg)  06/29/14 208 lb  (94.348 kg)  07/29/14 209 lb 8 oz (95.029 kg)    Physical Exam  Constitutional: He is oriented to person, place, and time. He appears well-developed and well-nourished.  HENT:  Head: Normocephalic and atraumatic.  Right Ear: External ear normal.  Left Ear: External ear normal.  Eyes: Conjunctivae and EOM are normal. Pupils are equal, round, and reactive to light.  Neck: Normal range of motion. Neck supple.  Cardiovascular: Normal rate, regular rhythm, normal heart sounds and intact distal pulses.   Pulmonary/Chest: Effort normal and breath sounds normal.  Abdominal: Soft. Bowel sounds are normal. There is no splenomegaly or hepatomegaly.  Genitourinary: Rectum normal, prostate normal and penis normal.  Musculoskeletal: Normal range of motion.  Neurological: He is alert and oriented to person, place, and time. He has normal reflexes.  Skin: No rash noted. No erythema.  Psychiatric: He has a normal mood and affect. His behavior is normal. Judgment and thought content normal.    Results for orders placed or performed in visit on 06/26/15  PSA  Result Value Ref Range   PSA from PP   HM COLONOSCOPY  Result Value Ref Range   HM Colonoscopy from [facility]       Assessment & Plan:   Problem List Items Addressed This Visit    None    Visit Diagnoses    PE (physical exam), annual    -  Primary    Relevant Orders    Comprehensive metabolic panel    Lipid panel    CBC with Differential/Platelet    PSA    Urinalysis, Routine w reflex microscopic (not at [facility])    TSH        Follow up plan: Return in about 1 year (around 06/25/2016), or if symptoms worsen or fail to improve, for Physical Exam.

## 2015-06-27 ENCOUNTER — Encounter: Payer: Self-pay | Admitting: Family Medicine

## 2015-06-27 LAB — CBC WITH DIFFERENTIAL/PLATELET
BASOS: 0 %
Basophils Absolute: 0 10*3/uL (ref 0.0–0.2)
EOS (ABSOLUTE): 0.1 10*3/uL (ref 0.0–0.4)
Eos: 1 %
Hematocrit: 47.8 % (ref 37.5–51.0)
Hemoglobin: 16.1 g/dL (ref 12.6–17.7)
IMMATURE GRANS (ABS): 0 10*3/uL (ref 0.0–0.1)
Immature Granulocytes: 0 %
LYMPHS ABS: 2.1 10*3/uL (ref 0.7–3.1)
Lymphs: 32 %
MCH: 32.3 pg (ref 26.6–33.0)
MCHC: 33.7 g/dL (ref 31.5–35.7)
MCV: 96 fL (ref 79–97)
Monocytes Absolute: 0.9 10*3/uL (ref 0.1–0.9)
Monocytes: 13 %
NEUTROS ABS: 3.7 10*3/uL (ref 1.4–7.0)
Neutrophils: 54 %
PLATELETS: 209 10*3/uL (ref 150–379)
RBC: 4.98 x10E6/uL (ref 4.14–5.80)
RDW: 13 % (ref 12.3–15.4)
WBC: 6.8 10*3/uL (ref 3.4–10.8)

## 2015-06-27 LAB — COMPREHENSIVE METABOLIC PANEL
ALK PHOS: 75 IU/L (ref 39–117)
ALT: 34 IU/L (ref 0–44)
AST: 20 IU/L (ref 0–40)
Albumin/Globulin Ratio: 1.8 (ref 1.1–2.5)
Albumin: 4.2 g/dL (ref 3.6–4.8)
BUN/Creatinine Ratio: 16 (ref 10–22)
BUN: 14 mg/dL (ref 8–27)
Bilirubin Total: 0.4 mg/dL (ref 0.0–1.2)
CO2: 24 mmol/L (ref 18–29)
CREATININE: 0.88 mg/dL (ref 0.76–1.27)
Calcium: 9.1 mg/dL (ref 8.6–10.2)
Chloride: 101 mmol/L (ref 97–106)
GFR calc Af Amer: 108 mL/min/{1.73_m2} (ref >59)
GFR calc non Af Amer: 93 mL/min/{1.73_m2} (ref >59)
GLOBULIN, TOTAL: 2.3 g/dL (ref 1.5–4.5)
Glucose: 89 mg/dL (ref 65–99)
POTASSIUM: 4.6 mmol/L (ref 3.5–5.2)
SODIUM: 140 mmol/L (ref 136–144)
Total Protein: 6.5 g/dL (ref 6.0–8.5)

## 2015-06-27 LAB — PSA: Prostate Specific Ag, Serum: 2.3 ng/mL (ref 0.0–4.0)

## 2015-06-27 LAB — LIPID PANEL
CHOLESTEROL TOTAL: 195 mg/dL (ref 100–199)
Chol/HDL Ratio: 7 ratio units — ABNORMAL HIGH (ref 0.0–5.0)
HDL: 28 mg/dL — AB (ref >39)
LDL Calculated: 103 mg/dL — ABNORMAL HIGH (ref 0–99)
Triglycerides: 321 mg/dL — ABNORMAL HIGH (ref 0–149)
VLDL Cholesterol Cal: 64 mg/dL — ABNORMAL HIGH (ref 5–40)

## 2015-06-27 LAB — TSH: TSH: 1.01 u[IU]/mL (ref 0.450–4.500)

## 2015-09-10 HISTORY — PX: GLAUCOMA SURGERY: SHX656

## 2015-10-13 ENCOUNTER — Ambulatory Visit
Admission: RE | Admit: 2015-10-13 | Discharge: 2015-10-13 | Disposition: A | Discharge status: Home / Self Care | Payer: Disability Insurance | Source: Ambulatory Visit | Attending: Family Medicine | Admitting: Family Medicine

## 2015-10-13 ENCOUNTER — Other Ambulatory Visit: Payer: Self-pay | Admitting: Family Medicine

## 2015-10-13 DIAGNOSIS — M5136 Other intervertebral disc degeneration, lumbar region: Secondary | ICD-10-CM | POA: Diagnosis not present

## 2015-10-13 DIAGNOSIS — Z9889 Other specified postprocedural states: Secondary | ICD-10-CM

## 2016-06-20 ENCOUNTER — Encounter: Payer: Self-pay | Admitting: Gastroenterology

## 2016-07-15 ENCOUNTER — Encounter: Payer: Self-pay | Admitting: Gastroenterology

## 2016-09-16 ENCOUNTER — Ambulatory Visit (AMBULATORY_SURGERY_CENTER): Payer: Self-pay | Admitting: *Deleted

## 2016-09-16 VITALS — Ht 71.0 in | Wt 212.6 lb

## 2016-09-16 DIAGNOSIS — Z86010 Personal history of colonic polyps: Secondary | ICD-10-CM

## 2016-09-16 MED ORDER — NA SULFATE-K SULFATE-MG SULF 17.5-3.13-1.6 GM/177ML PO SOLN: ORAL | 0 refills | Status: DC

## 2016-09-16 NOTE — Progress Notes (Signed)
No egg or soy allergy  No anesthesia or intubation problems per pt  No diet medications taken  Registered in [place]  Pt doesn't wear home oxygen  $15 off Suprep coupon given to pt

## 2016-09-23 ENCOUNTER — Encounter: Payer: Self-pay | Admitting: Gastroenterology

## 2016-09-30 ENCOUNTER — Ambulatory Visit (AMBULATORY_SURGERY_CENTER): Payer: BLUE CROSS/BLUE SHIELD | Admitting: Gastroenterology

## 2016-09-30 ENCOUNTER — Encounter: Payer: Self-pay | Admitting: Gastroenterology

## 2016-09-30 VITALS — BP 133/85 | HR 77 | Temp 98.9°F | Resp 18 | Ht 71.0 in | Wt 212.0 lb

## 2016-09-30 DIAGNOSIS — D125 Benign neoplasm of sigmoid colon: Secondary | ICD-10-CM

## 2016-09-30 DIAGNOSIS — K635 Polyp of colon: Secondary | ICD-10-CM

## 2016-09-30 DIAGNOSIS — Z86010 Personal history of colonic polyps: Secondary | ICD-10-CM | POA: Diagnosis not present

## 2016-09-30 DIAGNOSIS — D128 Benign neoplasm of rectum: Secondary | ICD-10-CM

## 2016-09-30 DIAGNOSIS — D129 Benign neoplasm of anus and anal canal: Secondary | ICD-10-CM

## 2016-09-30 MED ORDER — SODIUM CHLORIDE 0.9 % IV SOLN: 500.0000 mL | INTRAVENOUS | Status: DC

## 2016-09-30 NOTE — Progress Notes (Signed)
Report to PACU, RN, vss, BBS= Clear.  

## 2016-09-30 NOTE — Progress Notes (Signed)
Called to room to assist during endoscopic procedure.  Patient ID and intended procedure confirmed with present staff. Received instructions for my participation in the procedure from the performing physician.  

## 2016-09-30 NOTE — Op Note (Signed)
[facility] Patient Name: Steve Howell Procedure Date: 09/30/2016 8:48 AM MRN: [id number] Endoscopist: Milus Banister , MD Age: 61 Referring MD:  Date of Birth: Feb 11, 1956 Gender: Male Account #: [id number] Procedure:                Colonoscopy Indications:              High risk colon cancer surveillance: Personal                            history of colonic polyps (2012 colonoscopy 3                            polyps, two were SSAs) Medicines:                Monitored Anesthesia Care Procedure:                Pre-Anesthesia Assessment:                           - Prior to the procedure, a History and Physical                            was performed, and patient medications and                            allergies were reviewed. The patient's tolerance of                            previous anesthesia was also reviewed. The risks                            and benefits of the procedure and the sedation                            options and risks were discussed with the patient.                            All questions were answered, and informed consent                            was obtained. Prior Anticoagulants: The patient has                            taken no previous anticoagulant or antiplatelet                            agents. ASA Grade Assessment: II - A patient with                            mild systemic disease. After reviewing the risks                            and benefits, the patient was deemed in  satisfactory condition to undergo the procedure.                           After obtaining informed consent, the colonoscope                            was passed under direct vision. Throughout the                            procedure, the patient's blood pressure, pulse, and                            oxygen saturations were monitored continuously. The                            Model CF-HQ190L [id number]) scope was  introduced                            through the anus and advanced to the the cecum,                            identified by appendiceal orifice and ileocecal                            valve. The colonoscopy was performed without                            difficulty. The patient tolerated the procedure                            well. The quality of the bowel preparation was                            excellent. The ileocecal valve, appendiceal                            orifice, and rectum were photographed. Scope In: 8:51:16 AM Scope Out: 9:05:32 AM Scope Withdrawal Time: 0 hours 11 minutes 30 seconds  Total Procedure Duration: 0 hours 14 minutes 16 seconds  Findings:                 Three sessile polyps were found in the rectum and                            sigmoid colon. The polyps were 2 to 3 mm in size.                            These polyps were removed with a cold snare.                            Resection and retrieval were complete.                           The exam was otherwise without abnormality on  direct and retroflexion views. Complications:            No immediate complications. Estimated blood loss:                            None. Estimated Blood Loss:     Estimated blood loss: none. Impression:               - Three 2 to 3 mm polyps in the rectum and in the                            sigmoid colon, removed with a cold snare. Resected                            and retrieved.                           - The examination was otherwise normal on direct                            and retroflexion views. Recommendation:           - Patient has a contact number available for                            emergencies. The signs and symptoms of potential                            delayed complications were discussed with the                            patient. Return to normal activities tomorrow.                            Written discharge  instructions were provided to the                            patient.                           - Resume previous diet.                           - Continue present medications.                           You will receive a letter within 2-3 weeks with the                            pathology results and my final recommendations.                           If the polyp(s) is proven to be 'pre-cancerous' on                            pathology, you will need repeat colonoscopy in 3-5  years. If the polyp(s) is NOT 'precancerous' on                            pathology then you should repeat colon cancer                            screening in 10 years with colonoscopy without need                            for colon cancer screening by any method prior to                            then (including stool testing). Milus Banister, MD 09/30/2016 9:07:51 AM This report has been signed electronically.

## 2016-09-30 NOTE — Patient Instructions (Signed)
YOU HAD AN ENDOSCOPIC PROCEDURE TODAY AT THE [facility]:   Refer to the procedure report that was given to you for any specific questions about what was found during the examination.  If the procedure report does not answer your questions, please call your gastroenterologist to clarify.  If you requested that your care partner not be given the details of your procedure findings, then the procedure report has been included in a sealed envelope for you to review at your convenience later.  YOU SHOULD EXPECT: Some feelings of bloating in the abdomen. Passage of more gas than usual.  Walking can help get rid of the air that was put into your GI tract during the procedure and reduce the bloating. If you had a lower endoscopy (such as a colonoscopy or flexible sigmoidoscopy) you may notice spotting of blood in your stool or on the toilet paper. If you underwent a bowel prep for your procedure, you may not have a normal bowel movement for a few days.  Please Note:  You might notice some irritation and congestion in your nose or some drainage.  This is from the oxygen used during your procedure.  There is no need for concern and it should clear up in a day or so.  SYMPTOMS TO REPORT IMMEDIATELY:   Following lower endoscopy (colonoscopy or flexible sigmoidoscopy):  Excessive amounts of blood in the stool  Significant tenderness or worsening of abdominal pains  Swelling of the abdomen that is new, acute  Fever of 100F or higher   For urgent or emergent issues, a gastroenterologist can be reached at any hour by calling [phone].   DIET:  We do recommend a small meal at first, but then you may proceed to your regular diet.  Drink plenty of fluids but you should avoid alcoholic beverages for 24 hours.  ACTIVITY:  You should plan to take it easy for the rest of today and you should NOT DRIVE or use heavy machinery until tomorrow (because of the sedation medicines used during the test).     FOLLOW UP: Our staff will call the number listed on your records the next business day following your procedure to check on you and address any questions or concerns that you may have regarding the information given to you following your procedure. If we do not reach you, we will leave a message.  However, if you are feeling well and you are not experiencing any problems, there is no need to return our call.  We will assume that you have returned to your regular daily activities without incident.  If any biopsies were taken you will be contacted by phone or by letter within the next 1-3 weeks.  Please call us at [phone] if you have not heard about the biopsies in 3 weeks.    SIGNATURES/CONFIDENTIALITY: You and/or your care partner have signed paperwork which will be entered into your electronic medical record.  These signatures attest to the fact that that the information above on your After Visit Summary has been reviewed and is understood.  Full responsibility of the confidentiality of this discharge information lies with you and/or your care-partner.  Read all of the handouts given to you by your recovery room nurse. 

## 2016-10-01 ENCOUNTER — Telehealth: Payer: Self-pay

## 2016-10-01 ENCOUNTER — Telehealth: Payer: Self-pay | Admitting: *Deleted

## 2016-10-01 NOTE — Telephone Encounter (Signed)
  Follow up Call-  Call back number 09/30/2016  Post procedure Call Back phone  # [phone]  Permission to leave phone message Yes  Some recent data might be hidden     Patient questions:  Do you have a fever, pain , or abdominal swelling? No. Pain Score  0 *  Have you tolerated food without any problems? Yes.    Have you been able to return to your normal activities? Yes.    Do you have any questions about your discharge instructions: Diet   No. Medications  No. Follow up visit  No.  Do you have questions or concerns about your Care? No.  Actions: * If pain score is 4 or above: No action needed, pain <4.

## 2016-10-01 NOTE — Telephone Encounter (Signed)
No answer. Left message to call if questions or concerns. 

## 2016-10-02 ENCOUNTER — Encounter: Payer: Self-pay | Admitting: Family Medicine

## 2016-10-02 ENCOUNTER — Ambulatory Visit (INDEPENDENT_AMBULATORY_CARE_PROVIDER_SITE_OTHER): Payer: BLUE CROSS/BLUE SHIELD | Admitting: Family Medicine

## 2016-10-02 VITALS — BP 134/70 | HR 79 | Temp 98.7°F | Wt 211.6 lb

## 2016-10-02 DIAGNOSIS — R079 Chest pain, unspecified: Secondary | ICD-10-CM | POA: Diagnosis not present

## 2016-10-02 DIAGNOSIS — I10 Essential (primary) hypertension: Secondary | ICD-10-CM | POA: Diagnosis not present

## 2016-10-02 DIAGNOSIS — G4700 Insomnia, unspecified: Secondary | ICD-10-CM | POA: Insufficient documentation

## 2016-10-02 DIAGNOSIS — G4733 Obstructive sleep apnea (adult) (pediatric): Secondary | ICD-10-CM | POA: Diagnosis not present

## 2016-10-02 MED ORDER — TRAZODONE HCL 50 MG PO TABS: 50.0000 mg | ORAL_TABLET | Freq: Every evening | ORAL | 3 refills | Status: DC | PRN

## 2016-10-02 NOTE — Progress Notes (Signed)
BP 134/70   Pulse 79   Temp 98.7 F (37.1 C)   Wt 211 lb 9.6 oz (96 kg)   SpO2 98%   BMI 29.51 kg/m    Subjective:    Patient ID: Steve Howell, male    DOB: [birth year], 61 y.o.   MRN: [id number]  HPI: Steve Howell is a 61 y.o. male  Chief Complaint  Patient presents with  . Chest Pain   CHEST PAIN- Called today. Was having an intense dream on Friday or Saturday and woke up with Chest pain. Did not go to ER. Did not go to Urgent Care. He is not particularly worried about it, but his wife is quite concerned, so he decided to come in.  Time since onset: 5 days Duration: 30 seconds to a minute Onset: sudden Quality: felt like something something was being ripped out of his chest  Severity: severe Location: left para substernal Radiation: none Episode duration: 30 seconds to 1 minute Frequency: once Related to exertion: no Activity when pain started:  Trauma: no Anxiety/recent stressors: yes Status: better Treatments attempted: nothing  Current pain status: pain free Shortness of breath: no Cough: no Nausea: no Diaphoresis: no Heartburn: yes Palpitations: no   INSOMNIA- usually only gets 3-5 hours of sleep Duration: year or more Satisfied with sleep quality: no Difficulty falling asleep: yes- without drinking alcohol which he has been drinking nightly. Difficulty staying asleep: yes Waking a few hours after sleep onset: yes Early morning awakenings: no Daytime hypersomnolence: no Wakes feeling refreshed: no Good sleep hygiene: no Apnea: yes- using CPAP Snoring: no Depressed/anxious mood: yes- with the changes in his life Recent stress: yes Restless legs/nocturnal leg cramps: yes Chronic pain/arthritis: no History of sleep study: yes Treatments attempted: trazodone, melatonin and benadryl   Relevant past medical, surgical, family and social history reviewed and updated as indicated. Interim medical history since our last visit reviewed. Allergies and  medications reviewed and updated.  Review of Systems  Constitutional: Negative.   Respiratory: Negative.   Cardiovascular: Negative.   Psychiatric/Behavioral: Positive for dysphoric mood and sleep disturbance. Negative for agitation, behavioral problems, confusion, decreased concentration, hallucinations, self-injury and suicidal ideas. The patient is not nervous/anxious and is not hyperactive.     Per HPI unless specifically indicated above     Objective:    BP 134/70   Pulse 79   Temp 98.7 F (37.1 C)   Wt 211 lb 9.6 oz (96 kg)   SpO2 98%   BMI 29.51 kg/m   Wt Readings from Last 3 Encounters:  10/02/16 211 lb 9.6 oz (96 kg)  09/30/16 212 lb (96.2 kg)  09/16/16 212 lb 9.6 oz (96.4 kg)    Physical Exam  Constitutional: He is oriented to person, place, and time. He appears well-developed and well-nourished. No distress.  HENT:  Head: Normocephalic and atraumatic.  Right Ear: Hearing normal.  Left Ear: Hearing normal.  Nose: Nose normal.  Eyes: Conjunctivae and lids are normal. Right eye exhibits no discharge. Left eye exhibits no discharge. No scleral icterus.  Pulmonary/Chest: Effort normal. No respiratory distress.  Musculoskeletal: Normal range of motion.  Neurological: He is alert and oriented to person, place, and time.  Skin: Skin is intact. No rash noted.  Psychiatric: He has a normal mood and affect. His speech is normal and behavior is normal. Judgment and thought content normal. Cognition and memory are normal.    Results for orders placed or performed in visit on 06/26/15  Comprehensive metabolic panel  Result Value Ref Range   Glucose 89 65 - 99 mg/dL   BUN 14 8 - 27 mg/dL   Creatinine, Ser 0.88 0.76 - 1.27 mg/dL   GFR calc non Af Amer 93 >59 mL/min/1.73   GFR calc Af Amer 108 >59 mL/min/1.73   BUN/Creatinine Ratio 16 10 - 22   Sodium 140 136 - 144 mmol/L   Potassium 4.6 3.5 - 5.2 mmol/L   Chloride 101 97 - 106 mmol/L   CO2 24 18 - 29 mmol/L   Calcium  9.1 8.6 - 10.2 mg/dL   Total Protein 6.5 6.0 - 8.5 g/dL   Albumin 4.2 3.6 - 4.8 g/dL   Globulin, Total 2.3 1.5 - 4.5 g/dL   Albumin/Globulin Ratio 1.8 1.1 - 2.5   Bilirubin Total 0.4 0.0 - 1.2 mg/dL   Alkaline Phosphatase 75 39 - 117 IU/L   AST 20 0 - 40 IU/L   ALT 34 0 - 44 IU/L  Lipid panel  Result Value Ref Range   Cholesterol, Total 195 100 - 199 mg/dL   Triglycerides 321 (H) 0 - 149 mg/dL   HDL 28 (L) >39 mg/dL   VLDL Cholesterol Cal 64 (H) 5 - 40 mg/dL   LDL Calculated 103 (H) 0 - 99 mg/dL   Chol/HDL Ratio 7.0 (H) 0.0 - 5.0 ratio units  CBC with Differential/Platelet  Result Value Ref Range   WBC 6.8 3.4 - 10.8 x10E3/uL   RBC 4.98 4.14 - 5.80 x10E6/uL   Hemoglobin 16.1 12.6 - 17.7 g/dL   Hematocrit 47.8 37.5 - 51.0 %   MCV 96 79 - 97 fL   MCH 32.3 26.6 - 33.0 pg   MCHC 33.7 31.5 - 35.7 g/dL   RDW 13.0 12.3 - 15.4 %   Platelets 209 150 - 379 x10E3/uL   Neutrophils 54 %   Lymphs 32 %   Monocytes 13 %   Eos 1 %   Basos 0 %   Neutrophils Absolute 3.7 1.4 - 7.0 x10E3/uL   Lymphocytes Absolute 2.1 0.7 - 3.1 x10E3/uL   Monocytes Absolute 0.9 0.1 - 0.9 x10E3/uL   EOS (ABSOLUTE) 0.1 0.0 - 0.4 x10E3/uL   Basophils Absolute 0.0 0.0 - 0.2 x10E3/uL   Immature Granulocytes 0 %   Immature Grans (Abs) 0.0 0.0 - 0.1 x10E3/uL  PSA  Result Value Ref Range   Prostate Specific Ag, Serum 2.3 0.0 - 4.0 ng/mL  Urinalysis, Routine w reflex microscopic (not at [facility])  Result Value Ref Range   Specific Gravity, UA 1.020 1.005 - 1.030   pH, UA 7.0 5.0 - 7.5   Color, UA Yellow Yellow   Appearance Ur Clear Clear   Leukocytes, UA Negative Negative   Protein, UA Negative Negative/Trace   Glucose, UA Negative Negative   Ketones, UA Negative Negative   RBC, UA Negative Negative   Bilirubin, UA Negative Negative   Urobilinogen, Ur 1.0 0.2 - 1.0 mg/dL   Nitrite, UA Negative Negative  TSH  Result Value Ref Range   TSH 1.010 0.450 - 4.500 uIU/mL      Assessment & Plan:   Problem List  Items Addressed This Visit      Cardiovascular and Mediastinum   Hypertension    Better on recheck. Continue to monitor.         Respiratory   Obstructive sleep apnea    Using his CPAP nightly.         Other   Insomnia  Advised patient to stop drinking alcohol due to the late effect stimulant nature. Due to his glaucoma, cannot take ambien or belsomra. Thinks that he took trazodone without good results. Unclear on dose as does not appear to be in the computer. Will start him on 50-139m daily and follow up with Dr. CJeananne Ramaat his physical. Call with any concerns.        Other Visit Diagnoses    Chest pain, unspecified type    -  Primary   EKG normal today. Likely due to dream. Continue to monitor. Call with any concerns.    Relevant Orders   EKG 12-Lead (Completed)       Follow up plan: Return ASAP for PE with MAC.

## 2016-10-02 NOTE — Patient Instructions (Addendum)
Insomnia Insomnia is a sleep disorder that makes it difficult to fall asleep or to stay asleep. Insomnia can cause tiredness (fatigue), low energy, difficulty concentrating, mood swings, and poor performance at work or school. There are three different ways to classify insomnia:  Difficulty falling asleep.  Difficulty staying asleep.  Waking up too early in the morning. Any type of insomnia can be long-term (chronic) or short-term (acute). Both are common. Short-term insomnia usually lasts for three months or less. Chronic insomnia occurs at least three times a week for longer than three months. What are the causes? Insomnia may be caused by another condition, situation, or substance, such as:  Anxiety.  Certain medicines.  Gastroesophageal reflux disease (GERD) or other gastrointestinal conditions.  Asthma or other breathing conditions.  Restless legs syndrome, sleep apnea, or other sleep disorders.  Chronic pain.  Menopause. This may include hot flashes.  Stroke.  Abuse of alcohol, tobacco, or illegal drugs.  Depression.  Caffeine.  Neurological disorders, such as Alzheimer disease.  An overactive thyroid (hyperthyroidism). The cause of insomnia may not be known. What increases the risk? Risk factors for insomnia include:  Gender. Women are more commonly affected than men.  Age. Insomnia is more common as you get older.  Stress. This may involve your professional or personal life.  Income. Insomnia is more common in people with lower income.  Lack of exercise.  Irregular work schedule or night shifts.  Traveling between different time zones. What are the signs or symptoms? If you have insomnia, trouble falling asleep or trouble staying asleep is the main symptom. This may lead to other symptoms, such as:  Feeling fatigued.  Feeling nervous about going to sleep.  Not feeling rested in the morning.  Having trouble concentrating.  Feeling irritable,  anxious, or depressed. How is this treated? Treatment for insomnia depends on the cause. If your insomnia is caused by an underlying condition, treatment will focus on addressing the condition. Treatment may also include:  Medicines to help you sleep.  Counseling or therapy.  Lifestyle adjustments. Follow these instructions at home:  Take medicines only as directed by your health care provider.  Keep regular sleeping and waking hours. Avoid naps.  Keep a sleep diary to help you and your health care provider figure out what could be causing your insomnia. Include:  When you sleep.  When you wake up during the night.  How well you sleep.  How rested you feel the next day.  Any side effects of medicines you are taking.  What you eat and drink.  Make your bedroom a comfortable place where it is easy to fall asleep:  Put up shades or special blackout curtains to block light from outside.  Use a white noise machine to block noise.  Keep the temperature cool.  Exercise regularly as directed by your health care provider. Avoid exercising right before bedtime.  Use relaxation techniques to manage stress. Ask your health care provider to suggest some techniques that may work well for you. These may include:  Breathing exercises.  Routines to release muscle tension.  Visualizing peaceful scenes.  Cut back on alcohol, caffeinated beverages, and cigarettes, especially close to bedtime. These can disrupt your sleep.  Do not overeat or eat spicy foods right before bedtime. This can lead to digestive discomfort that can make it hard for you to sleep.  Limit screen use before bedtime. This includes:  Watching TV.  Using your smartphone, tablet, and computer.  Stick to a   routine. This can help you fall asleep faster. Try to do a quiet activity, brush your teeth, and go to bed at the same time each night.  Get out of bed if you are still awake after 15 minutes of trying to  sleep. Keep the lights down, but try reading or doing a quiet activity. When you feel sleepy, go back to bed.  Make sure that you drive carefully. Avoid driving if you feel very sleepy.  Keep all follow-up appointments as directed by your health care provider. This is important. Contact a health care provider if:  You are tired throughout the day or have trouble in your daily routine due to sleepiness.  You continue to have sleep problems or your sleep problems get worse. Get help right away if:  You have serious thoughts about hurting yourself or someone else. This information is not intended to replace advice given to you by your health care provider. Make sure you discuss any questions you have with your health care provider. Document Released: 08/23/2000 Document Revised: 01/26/2016 Document Reviewed: 05/27/2014 Elsevier Interactive Patient Education  2017 [employer]  

## 2016-10-02 NOTE — Assessment & Plan Note (Signed)
Using his CPAP nightly

## 2016-10-02 NOTE — Assessment & Plan Note (Signed)
Better on recheck. Continue to monitor.

## 2016-10-02 NOTE — Assessment & Plan Note (Signed)
Advised patient to stop drinking alcohol due to the late effect stimulant nature. Due to his glaucoma, cannot take ambien or belsomra. Thinks that he took trazodone without good results. Unclear on dose as does not appear to be in the computer. Will start him on 50-100mg  daily and follow up with Dr. Jeananne Rama at his physical. Call with any concerns.

## 2016-10-06 ENCOUNTER — Encounter: Payer: Self-pay | Admitting: Gastroenterology

## 2016-10-30 NOTE — Discharge Instructions (Signed)

## 2016-11-01 ENCOUNTER — Encounter: Payer: Self-pay | Admitting: *Deleted

## 2016-11-04 ENCOUNTER — Ambulatory Visit
Admission: RE | Admit: 2016-11-04 | Discharge: 2016-11-04 | Disposition: A | Discharge status: Home / Self Care | Payer: BLUE CROSS/BLUE SHIELD | Source: Ambulatory Visit | Attending: Ophthalmology | Admitting: Ophthalmology

## 2016-11-04 ENCOUNTER — Ambulatory Visit: Payer: BLUE CROSS/BLUE SHIELD | Admitting: Anesthesiology

## 2016-11-04 ENCOUNTER — Encounter
Admission: RE | Disposition: A | Discharge status: Home / Self Care | Payer: Self-pay | Source: Ambulatory Visit | Attending: Ophthalmology

## 2016-11-04 DIAGNOSIS — Z87891 Personal history of nicotine dependence: Secondary | ICD-10-CM | POA: Insufficient documentation

## 2016-11-04 DIAGNOSIS — H401313 Pigmentary glaucoma, right eye, severe stage: Secondary | ICD-10-CM | POA: Diagnosis present

## 2016-11-04 DIAGNOSIS — I10 Essential (primary) hypertension: Secondary | ICD-10-CM | POA: Insufficient documentation

## 2016-11-04 DIAGNOSIS — G4730 Sleep apnea, unspecified: Secondary | ICD-10-CM | POA: Insufficient documentation

## 2016-11-04 HISTORY — PX: PHOTOCOAGULATION WITH LASER: SHX6027

## 2016-11-04 SURGERY — PHOTOCOAGULATION, EYE, USING LASER
Anesthesia: Monitor Anesthesia Care | Site: Eye | Laterality: Right | Wound class: Clean

## 2016-11-04 MED ORDER — ALFENTANIL 500 MCG/ML IJ INJ
INJECTION | INTRAMUSCULAR | Status: DC | PRN
  Administered 2016-11-04: 1000 ug via INTRAVENOUS

## 2016-11-04 MED ORDER — MIDAZOLAM HCL 2 MG/2ML IJ SOLN
INTRAMUSCULAR | Status: DC | PRN
  Administered 2016-11-04: 2 mg via INTRAVENOUS

## 2016-11-04 MED ORDER — LACTATED RINGERS IV SOLN: INTRAVENOUS | Status: DC

## 2016-11-04 MED ORDER — PROPOFOL 10 MG/ML IV BOLUS
INTRAVENOUS | Status: DC | PRN
  Administered 2016-11-04: 30 mg via INTRAVENOUS

## 2016-11-04 MED ORDER — ACETAMINOPHEN 160 MG/5ML PO SOLN: 325.0000 mg | ORAL | Status: DC | PRN

## 2016-11-04 MED ORDER — TETRACAINE HCL 0.5 % OP SOLN
OPHTHALMIC | Status: DC | PRN
  Administered 2016-11-04: 1 [drp] via OPHTHALMIC

## 2016-11-04 MED ORDER — ACETAMINOPHEN 325 MG PO TABS: 325.0000 mg | ORAL_TABLET | ORAL | Status: DC | PRN

## 2016-11-04 SURGICAL SUPPLY — 11 items
BANDAGE EYE OVAL (MISCELLANEOUS) IMPLANT
DEVICE G-PROBE SGL USE (Laser) IMPLANT
DEVICE MICRO PULS P3 SGL USE (Laser) ×3 IMPLANT
G-PROBE SGL USE (Laser)
GAUZE SPONGE 4X4 12PLY STRL (GAUZE/BANDAGES/DRESSINGS) ×3 IMPLANT
NDL RETROBULBAR .5 NSTRL (NEEDLE) IMPLANT
NEEDLE FILTER BLUNT 18X 1/2SAF (NEEDLE)
NEEDLE FILTER BLUNT 18X1 1/2 (NEEDLE) IMPLANT
SYRINGE 10CC LL (SYRINGE) IMPLANT
WATER STERILE IRR 250ML POUR (IV SOLUTION) ×3 IMPLANT
WATER STERILE IRR 500ML POUR (IV SOLUTION) IMPLANT

## 2016-11-04 NOTE — Transfer of Care (Signed)
Immediate Anesthesia Transfer of Care Note  Patient: ERCELL RAZON  Procedure(s) Performed: Procedure(s): transcleral diode cyclophotocoagulation (Right)  Patient Location: PACU  Anesthesia Type: MAC  Level of Consciousness: awake, alert  and patient cooperative  Airway and Oxygen Therapy: Patient Spontanous Breathing and Patient connected to supplemental oxygen  Post-op Assessment: Post-op Vital signs reviewed, Patient's Cardiovascular Status Stable, Respiratory Function Stable, Patent Airway and No signs of Nausea or vomiting  Post-op Vital Signs: Reviewed and stable  Complications: No apparent anesthesia complications

## 2016-11-04 NOTE — Transfer of Care (Signed)
Immediate Anesthesia Transfer of Care Note  Patient: Steve Howell  Procedure(s) Performed: Procedure(s): transcleral diode cyclophotocoagulation (Right)  Patient Location: PACU  Anesthesia Type: MAC  Level of Consciousness: awake, alert  and patient cooperative  Airway and Oxygen Therapy: Patient Spontanous Breathing and Patient connected to supplemental oxygen  Post-op Assessment: Post-op Vital signs reviewed, Patient's Cardiovascular Status Stable, Respiratory Function Stable, Patent Airway and No signs of Nausea or vomiting  Post-op Vital Signs: Reviewed and stable  Complications: No apparent anesthesia complications

## 2016-11-04 NOTE — Op Note (Signed)
DATE OF SURGERY: 11/04/2016  PREOPERATIVE DIAGNOSES: Severe stage pigmentary glaucoma, right eye  POSTOPERATIVE DIAGNOSES: Same  PROCEDURES PERFORMED: Transscleral diode cyclophotocoagulation, right eye  SURGEON: Almon Hercules, M.D.  ANESTHESIA: MAC  COMPLICATIONS: None.  INDICATIONS FOR PROCEDURE: Steve Howell is a 61 y.o. year-old male with uncontrolled pigmentary open angle glaucoma. The risks and benefits of glaucoma surgery were discussed with the patient, and he consented for a diode laser surgery.  PROCEDURE IN DETAIL: The eye for surgery was verified during the time-out procedure in the operating room. Monitored anesthesia care was initiated. A micropulse probe was applied to each hemilimbus with the following settings: 2022m, 31.3% duty cycle, 90 seconds x 3 applications. The area of previous glaucoma surgery was not treated.  The patient tolerated the procedure well and was transferred to the Post-operative Care Unit in stable condition.

## 2016-11-04 NOTE — Anesthesia Postprocedure Evaluation (Signed)
Anesthesia Post Note  Patient: Steve Howell  Procedure(s) Performed: Procedure(s) (LRB): transcleral diode cyclophotocoagulation (Right)  Patient location during evaluation: PACU Anesthesia Type: MAC Level of consciousness: awake and alert Pain management: pain level controlled Vital Signs Assessment: post-procedure vital signs reviewed and stable Respiratory status: spontaneous breathing, nonlabored ventilation, respiratory function stable and patient connected to nasal cannula oxygen Cardiovascular status: stable and blood pressure returned to baseline Anesthetic complications: no    Trecia Rogers

## 2016-11-04 NOTE — OR Nursing (Signed)
A micropulse probe was applied to each hemilimbus with the following settings: 2059mW, 31.3% duty cycle, 90 seconds x 3 applications.

## 2016-11-04 NOTE — Anesthesia Preprocedure Evaluation (Signed)
Anesthesia Evaluation  Patient identified by MRN, date of birth, ID band Patient awake    Reviewed: Allergy & Precautions, H&P , NPO status , Patient's Chart, lab work & pertinent test results, reviewed documented beta blocker date and time   Airway Mallampati: II  TM Distance: >3 FB Neck ROM: full    Dental no notable dental hx.    Pulmonary sleep apnea and Continuous Positive Airway Pressure Ventilation , former smoker,    Pulmonary exam normal breath sounds clear to auscultation       Cardiovascular Exercise Tolerance: Good hypertension, negative cardio ROS Normal cardiovascular exam Rhythm:regular Rate:Normal     Neuro/Psych negative neurological ROS  negative psych ROS   GI/Hepatic Neg liver ROS, GERD  ,  Endo/Other  negative endocrine ROS  Renal/GU negative Renal ROS  negative genitourinary   Musculoskeletal   Abdominal   Peds  Hematology negative hematology ROS (+)   Anesthesia Other Findings   Reproductive/Obstetrics negative OB ROS                             Anesthesia Physical Anesthesia Plan  ASA: II  Anesthesia Plan: MAC   Post-op Pain Management:    Induction:   Airway Management Planned:   Additional Equipment:   Intra-op Plan:   Post-operative Plan:   Informed Consent: I have reviewed the patients History and Physical, chart, labs and discussed the procedure including the risks, benefits and alternatives for the proposed anesthesia with the patient or authorized representative who has indicated his/her understanding and acceptance.   Dental Advisory Given  Plan Discussed with: CRNA  Anesthesia Plan Comments:         Anesthesia Quick Evaluation

## 2016-11-04 NOTE — Anesthesia Procedure Notes (Signed)
Procedure Name: MAC Performed by: Jaylenne Hamelin Pre-anesthesia Checklist: Patient identified, Emergency Drugs available, Suction available, Timeout performed and Patient being monitored Patient Re-evaluated:Patient Re-evaluated prior to inductionOxygen Delivery Method: Nasal cannula Placement Confirmation: positive ETCO2     

## 2016-11-04 NOTE — H&P (Signed)
H+P reviewed and is up to date, please see paper chart.  

## 2016-11-05 ENCOUNTER — Encounter: Payer: Self-pay | Admitting: Ophthalmology

## 2016-11-26 ENCOUNTER — Ambulatory Visit (INDEPENDENT_AMBULATORY_CARE_PROVIDER_SITE_OTHER): Payer: BLUE CROSS/BLUE SHIELD | Admitting: Family Medicine

## 2016-11-26 ENCOUNTER — Encounter: Payer: Self-pay | Admitting: Family Medicine

## 2016-11-26 VITALS — BP 129/83 | HR 113 | Ht 71.26 in | Wt 202.0 lb

## 2016-11-26 DIAGNOSIS — Z13220 Encounter for screening for lipoid disorders: Secondary | ICD-10-CM

## 2016-11-26 DIAGNOSIS — G4733 Obstructive sleep apnea (adult) (pediatric): Secondary | ICD-10-CM | POA: Diagnosis not present

## 2016-11-26 DIAGNOSIS — L570 Actinic keratosis: Secondary | ICD-10-CM | POA: Diagnosis not present

## 2016-11-26 DIAGNOSIS — Z0000 Encounter for general adult medical examination without abnormal findings: Secondary | ICD-10-CM

## 2016-11-26 DIAGNOSIS — Z125 Encounter for screening for malignant neoplasm of prostate: Secondary | ICD-10-CM

## 2016-11-26 DIAGNOSIS — H409 Unspecified glaucoma: Secondary | ICD-10-CM | POA: Insufficient documentation

## 2016-11-26 DIAGNOSIS — G4700 Insomnia, unspecified: Secondary | ICD-10-CM | POA: Diagnosis not present

## 2016-11-26 DIAGNOSIS — I10 Essential (primary) hypertension: Secondary | ICD-10-CM

## 2016-11-26 DIAGNOSIS — Z1329 Encounter for screening for other suspected endocrine disorder: Secondary | ICD-10-CM | POA: Diagnosis not present

## 2016-11-26 LAB — URINALYSIS, ROUTINE W REFLEX MICROSCOPIC
BILIRUBIN UA: NEGATIVE
GLUCOSE, UA: NEGATIVE
KETONES UA: NEGATIVE
LEUKOCYTES UA: NEGATIVE
Nitrite, UA: NEGATIVE
RBC UA: NEGATIVE
Urobilinogen, Ur: 0.2 mg/dL (ref 0.2–1.0)
pH, UA: 6 (ref 5.0–7.5)

## 2016-11-26 LAB — MICROSCOPIC EXAMINATION: Bacteria, UA: NONE SEEN

## 2016-11-26 NOTE — Assessment & Plan Note (Signed)
Uses OTC meds for sleep

## 2016-11-26 NOTE — Assessment & Plan Note (Signed)
The current medical regimen is effective;  continue present plan and medications.  

## 2016-11-26 NOTE — Progress Notes (Signed)
BP 129/83   Pulse (!) 113   Ht 5' 11.26" (1.81 m)   Wt 202 lb (91.6 kg)   SpO2 97%   BMI 27.97 kg/m    Subjective:    Patient ID: Steve Howell, male    DOB: 02/10/1956, 61 y.o.   MRN: [id number]  HPI: Steve Howell is a 61 y.o. male  Chief Complaint  Patient presents with  . Annual Exam   Patient with some skin lesions right arm with recurrent seborrheic keratosis left cheek with actinic keratosis. Areas were frozen with cryo-unit Patient otherwise doing well see an eye doctor for glaucoma care had laser surgery earlier this winter. Uses CPAP without problems. Blood pressure controlled with diet exercise Relevant past medical, surgical, family and social history reviewed and updated as indicated. Interim medical history since our last visit reviewed. Allergies and medications reviewed and updated.  Review of Systems  Constitutional: Negative.   HENT: Negative.   Eyes: Negative.   Respiratory: Negative.   Cardiovascular: Negative.   Gastrointestinal: Negative.   Endocrine: Negative.   Genitourinary: Negative.   Musculoskeletal: Negative.   Skin: Negative.   Allergic/Immunologic: Negative.   Neurological: Negative.   Hematological: Negative.   Psychiatric/Behavioral: Negative.     Per HPI unless specifically indicated above     Objective:    BP 129/83   Pulse (!) 113   Ht 5' 11.26" (1.81 m)   Wt 202 lb (91.6 kg)   SpO2 97%   BMI 27.97 kg/m   Wt Readings from Last 3 Encounters:  11/26/16 202 lb (91.6 kg)  11/04/16 209 lb (94.8 kg)  10/02/16 211 lb 9.6 oz (96 kg)    Physical Exam  Constitutional: He is oriented to person, place, and time. He appears well-developed and well-nourished.  HENT:  Head: Normocephalic and atraumatic.  Right Ear: External ear normal.  Left Ear: External ear normal.  Eyes: Conjunctivae and EOM are normal. Pupils are equal, round, and reactive to light.  Neck: Normal range of motion. Neck supple.  Cardiovascular: Normal  rate, regular rhythm, normal heart sounds and intact distal pulses.   Pulmonary/Chest: Effort normal and breath sounds normal.  Abdominal: Soft. Bowel sounds are normal. There is no splenomegaly or hepatomegaly.  Genitourinary: Rectum normal, prostate normal and penis normal.  Musculoskeletal: Normal range of motion.  Neurological: He is alert and oriented to person, place, and time. He has normal reflexes.  Skin: No rash noted. No erythema.  SK and AK destroyed with cryo  Psychiatric: He has a normal mood and affect. His behavior is normal. Judgment and thought content normal.    Results for orders placed or performed in visit on 06/26/15  Comprehensive metabolic panel  Result Value Ref Range   Glucose 89 65 - 99 mg/dL   BUN 14 8 - 27 mg/dL   Creatinine, Ser 0.88 0.76 - 1.27 mg/dL   GFR calc non Af Amer 93 >59 mL/min/1.73   GFR calc Af Amer 108 >59 mL/min/1.73   BUN/Creatinine Ratio 16 10 - 22   Sodium 140 136 - 144 mmol/L   Potassium 4.6 3.5 - 5.2 mmol/L   Chloride 101 97 - 106 mmol/L   CO2 24 18 - 29 mmol/L   Calcium 9.1 8.6 - 10.2 mg/dL   Total Protein 6.5 6.0 - 8.5 g/dL   Albumin 4.2 3.6 - 4.8 g/dL   Globulin, Total 2.3 1.5 - 4.5 g/dL   Albumin/Globulin Ratio 1.8 1.1 - 2.5   Bilirubin  Total 0.4 0.0 - 1.2 mg/dL   Alkaline Phosphatase 75 39 - 117 IU/L   AST 20 0 - 40 IU/L   ALT 34 0 - 44 IU/L  Lipid panel  Result Value Ref Range   Cholesterol, Total 195 100 - 199 mg/dL   Triglycerides 321 (H) 0 - 149 mg/dL   HDL 28 (L) >39 mg/dL   VLDL Cholesterol Cal 64 (H) 5 - 40 mg/dL   LDL Calculated 103 (H) 0 - 99 mg/dL   Chol/HDL Ratio 7.0 (H) 0.0 - 5.0 ratio units  CBC with Differential/Platelet  Result Value Ref Range   WBC 6.8 3.4 - 10.8 x10E3/uL   RBC 4.98 4.14 - 5.80 x10E6/uL   Hemoglobin 16.1 12.6 - 17.7 g/dL   Hematocrit 47.8 37.5 - 51.0 %   MCV 96 79 - 97 fL   MCH 32.3 26.6 - 33.0 pg   MCHC 33.7 31.5 - 35.7 g/dL   RDW 13.0 12.3 - 15.4 %   Platelets 209 150 - 379  x10E3/uL   Neutrophils 54 %   Lymphs 32 %   Monocytes 13 %   Eos 1 %   Basos 0 %   Neutrophils Absolute 3.7 1.4 - 7.0 x10E3/uL   Lymphocytes Absolute 2.1 0.7 - 3.1 x10E3/uL   Monocytes Absolute 0.9 0.1 - 0.9 x10E3/uL   EOS (ABSOLUTE) 0.1 0.0 - 0.4 x10E3/uL   Basophils Absolute 0.0 0.0 - 0.2 x10E3/uL   Immature Granulocytes 0 %   Immature Grans (Abs) 0.0 0.0 - 0.1 x10E3/uL  PSA  Result Value Ref Range   Prostate Specific Ag, Serum 2.3 0.0 - 4.0 ng/mL  Urinalysis, Routine w reflex microscopic (not at [facility])  Result Value Ref Range   Specific Gravity, UA 1.020 1.005 - 1.030   pH, UA 7.0 5.0 - 7.5   Color, UA Yellow Yellow   Appearance Ur Clear Clear   Leukocytes, UA Negative Negative   Protein, UA Negative Negative/Trace   Glucose, UA Negative Negative   Ketones, UA Negative Negative   RBC, UA Negative Negative   Bilirubin, UA Negative Negative   Urobilinogen, Ur 1.0 0.2 - 1.0 mg/dL   Nitrite, UA Negative Negative  TSH  Result Value Ref Range   TSH 1.010 0.450 - 4.500 uIU/mL      Assessment & Plan:   Problem List Items Addressed This Visit      Cardiovascular and Mediastinum   RESOLVED: Hypertension    The current medical regimen is effective;  continue present plan and medications.       Relevant Orders   CBC with Differential/Platelet   Comprehensive metabolic panel   Urinalysis, Routine w reflex microscopic     Respiratory   Obstructive sleep apnea     Other   Insomnia    Uses OTC meds for sleep      Glaucoma    Has had surgery       Other Visit Diagnoses    Annual physical exam    -  Primary   Relevant Orders   CBC with Differential/Platelet   Comprehensive metabolic panel   Lipid panel   PSA   TSH   Urinalysis, Routine w reflex microscopic   Screening cholesterol level       Relevant Orders   Lipid panel   Prostate cancer screening       Relevant Orders   PSA   Thyroid disorder screen       Relevant Orders   TSH  Follow up  plan: Return in about 1 year (around 11/26/2017) for Physical Exam.

## 2016-11-26 NOTE — Assessment & Plan Note (Signed)
Has had surgery

## 2016-11-27 ENCOUNTER — Encounter: Payer: Self-pay | Admitting: Family Medicine

## 2016-11-27 LAB — LIPID PANEL
Chol/HDL Ratio: 6.8 ratio units — ABNORMAL HIGH (ref 0.0–5.0)
Cholesterol, Total: 204 mg/dL — ABNORMAL HIGH (ref 100–199)
HDL: 30 mg/dL — ABNORMAL LOW (ref >39)
LDL Calculated: 124 mg/dL — ABNORMAL HIGH (ref 0–99)
TRIGLYCERIDES: 250 mg/dL — AB (ref 0–149)
VLDL Cholesterol Cal: 50 mg/dL — ABNORMAL HIGH (ref 5–40)

## 2016-11-27 LAB — CBC WITH DIFFERENTIAL/PLATELET
BASOS ABS: 0 10*3/uL (ref 0.0–0.2)
Basos: 0 %
EOS (ABSOLUTE): 0.1 10*3/uL (ref 0.0–0.4)
Eos: 1 %
HEMOGLOBIN: 15.8 g/dL (ref 13.0–17.7)
Hematocrit: 47.6 % (ref 37.5–51.0)
Immature Grans (Abs): 0 10*3/uL (ref 0.0–0.1)
Immature Granulocytes: 0 %
LYMPHS ABS: 1.7 10*3/uL (ref 0.7–3.1)
Lymphs: 28 %
MCH: 31.7 pg (ref 26.6–33.0)
MCHC: 33.2 g/dL (ref 31.5–35.7)
MCV: 95 fL (ref 79–97)
MONOCYTES: 15 %
MONOS ABS: 0.9 10*3/uL (ref 0.1–0.9)
NEUTROS ABS: 3.4 10*3/uL (ref 1.4–7.0)
Neutrophils: 56 %
Platelets: 213 10*3/uL (ref 150–379)
RBC: 4.99 x10E6/uL (ref 4.14–5.80)
RDW: 13.1 % (ref 12.3–15.4)
WBC: 6.1 10*3/uL (ref 3.4–10.8)

## 2016-11-27 LAB — COMPREHENSIVE METABOLIC PANEL
ALK PHOS: 75 IU/L (ref 39–117)
ALT: 35 IU/L (ref 0–44)
AST: 22 IU/L (ref 0–40)
Albumin/Globulin Ratio: 2.1 (ref 1.2–2.2)
Albumin: 4.5 g/dL (ref 3.6–4.8)
BILIRUBIN TOTAL: 0.8 mg/dL (ref 0.0–1.2)
BUN/Creatinine Ratio: 13 (ref 10–24)
BUN: 10 mg/dL (ref 8–27)
CHLORIDE: 103 mmol/L (ref 96–106)
CO2: 26 mmol/L (ref 18–29)
Calcium: 9.2 mg/dL (ref 8.6–10.2)
Creatinine, Ser: 0.75 mg/dL — ABNORMAL LOW (ref 0.76–1.27)
GFR calc Af Amer: 114 mL/min/{1.73_m2} (ref >59)
GFR calc non Af Amer: 99 mL/min/{1.73_m2} (ref >59)
GLOBULIN, TOTAL: 2.1 g/dL (ref 1.5–4.5)
GLUCOSE: 124 mg/dL — AB (ref 65–99)
Potassium: 4.5 mmol/L (ref 3.5–5.2)
SODIUM: 143 mmol/L (ref 134–144)
Total Protein: 6.6 g/dL (ref 6.0–8.5)

## 2016-11-27 LAB — TSH: TSH: 1.17 u[IU]/mL (ref 0.450–4.500)

## 2016-11-27 LAB — PSA: Prostate Specific Ag, Serum: 2.8 ng/mL (ref 0.0–4.0)

## 2017-04-25 DIAGNOSIS — H4051X3 Glaucoma secondary to other eye disorders, right eye, severe stage: Secondary | ICD-10-CM | POA: Diagnosis not present

## 2017-08-05 DIAGNOSIS — H4051X3 Glaucoma secondary to other eye disorders, right eye, severe stage: Secondary | ICD-10-CM | POA: Diagnosis not present

## 2017-09-15 DIAGNOSIS — H4051X3 Glaucoma secondary to other eye disorders, right eye, severe stage: Secondary | ICD-10-CM | POA: Diagnosis not present

## 2017-12-25 DIAGNOSIS — N200 Calculus of kidney: Secondary | ICD-10-CM | POA: Diagnosis not present

## 2017-12-25 DIAGNOSIS — M545 Low back pain: Secondary | ICD-10-CM | POA: Diagnosis not present

## 2018-03-03 DIAGNOSIS — H4051X3 Glaucoma secondary to other eye disorders, right eye, severe stage: Secondary | ICD-10-CM | POA: Diagnosis not present

## 2018-03-16 DIAGNOSIS — H21232 Degeneration of iris (pigmentary), left eye: Secondary | ICD-10-CM | POA: Diagnosis not present

## 2018-03-16 DIAGNOSIS — H401313 Pigmentary glaucoma, right eye, severe stage: Secondary | ICD-10-CM | POA: Diagnosis not present

## 2018-03-31 ENCOUNTER — Encounter: Payer: Self-pay | Admitting: Family Medicine

## 2018-03-31 ENCOUNTER — Ambulatory Visit (INDEPENDENT_AMBULATORY_CARE_PROVIDER_SITE_OTHER): Payer: PPO | Admitting: Family Medicine

## 2018-03-31 VITALS — BP 149/93 | HR 75 | Ht 71.0 in | Wt 205.0 lb

## 2018-03-31 DIAGNOSIS — N200 Calculus of kidney: Secondary | ICD-10-CM | POA: Diagnosis not present

## 2018-03-31 DIAGNOSIS — G4733 Obstructive sleep apnea (adult) (pediatric): Secondary | ICD-10-CM | POA: Diagnosis not present

## 2018-03-31 DIAGNOSIS — G4700 Insomnia, unspecified: Secondary | ICD-10-CM

## 2018-03-31 DIAGNOSIS — Z0000 Encounter for general adult medical examination without abnormal findings: Secondary | ICD-10-CM

## 2018-03-31 DIAGNOSIS — H409 Unspecified glaucoma: Secondary | ICD-10-CM

## 2018-03-31 DIAGNOSIS — I10 Essential (primary) hypertension: Secondary | ICD-10-CM | POA: Diagnosis not present

## 2018-03-31 DIAGNOSIS — Z125 Encounter for screening for malignant neoplasm of prostate: Secondary | ICD-10-CM

## 2018-03-31 DIAGNOSIS — Z1329 Encounter for screening for other suspected endocrine disorder: Secondary | ICD-10-CM

## 2018-03-31 LAB — URINALYSIS, ROUTINE W REFLEX MICROSCOPIC
Bilirubin, UA: NEGATIVE
Glucose, UA: NEGATIVE
Ketones, UA: NEGATIVE
Leukocytes, UA: NEGATIVE
Nitrite, UA: NEGATIVE
Protein, UA: NEGATIVE
RBC, UA: NEGATIVE
SPEC GRAV UA: 1.015 (ref 1.005–1.030)
Urobilinogen, Ur: 0.2 mg/dL (ref 0.2–1.0)
pH, UA: 7.5 (ref 5.0–7.5)

## 2018-03-31 MED ORDER — DOXYCYCLINE HYCLATE 100 MG PO TABS: 100.0000 mg | ORAL_TABLET | Freq: Two times a day (BID) | ORAL | 1 refills | Status: DC

## 2018-03-31 NOTE — Assessment & Plan Note (Signed)
Followed by eye Dr

## 2018-03-31 NOTE — Assessment & Plan Note (Signed)
Discussed insomnia getting a little bit better with and insight for home building.

## 2018-03-31 NOTE — Progress Notes (Signed)
BP (!) 149/93   Pulse 75   Ht 5\' 11"  (1.803 m)   Wt 205 lb (93 kg)   SpO2 100%   BMI 28.59 kg/m    Subjective:    Patient ID: Steve Howell, male    DOB: [birth year], 62 y.o.   MRN: [id number]  HPI: Steve Howell is a 62 y.o. male  Chief Complaint  Patient presents with  . Annual Exam  Patient all in all doing well has a lot going on with building his house up in the [place].  But otherwise doing well using CPAP without problems good resolution of daytime drowsiness. Working with glaucoma doctor on special treatment for his glaucoma. Patient also with some RMSF type symptoms is concerned because of tick exposure.  We will go ahead and give doxycycline to have.  Patient education given on risk and benefit.  Relevant past medical, surgical, family and social history reviewed and updated as indicated. Interim medical history since our last visit reviewed. Allergies and medications reviewed and updated.  Review of Systems  Constitutional: Negative.   HENT: Negative.   Eyes: Negative.   Respiratory: Negative.   Cardiovascular: Negative.   Gastrointestinal: Negative.   Endocrine: Negative.   Genitourinary: Negative.   Musculoskeletal: Negative.   Skin: Negative.   Allergic/Immunologic: Negative.   Neurological: Negative.   Hematological: Negative.   Psychiatric/Behavioral: Negative.     Per HPI unless specifically indicated above     Objective:    BP (!) 149/93   Pulse 75   Ht 5\' 11"  (1.803 m)   Wt 205 lb (93 kg)   SpO2 100%   BMI 28.59 kg/m   Wt Readings from Last 3 Encounters:  03/31/18 205 lb (93 kg)  11/26/16 202 lb (91.6 kg)  11/04/16 209 lb (94.8 kg)    Physical Exam  Constitutional: He is oriented to person, place, and time. He appears well-developed and well-nourished.  HENT:  Head: Normocephalic and atraumatic.  Right Ear: External ear normal.  Left Ear: External ear normal.  Eyes: Pupils are equal, round, and reactive to light. Conjunctivae  and EOM are normal.  Neck: Normal range of motion. Neck supple.  Cardiovascular: Normal rate, regular rhythm, normal heart sounds and intact distal pulses.  Pulmonary/Chest: Effort normal and breath sounds normal.  Abdominal: Soft. Bowel sounds are normal. There is no splenomegaly or hepatomegaly.  Genitourinary: Rectum normal, prostate normal and penis normal.  Musculoskeletal: Normal range of motion.  Neurological: He is alert and oriented to person, place, and time. He has normal reflexes.  Skin: No rash noted. No erythema.  Psychiatric: He has a normal mood and affect. His behavior is normal. Judgment and thought content normal.    Results for orders placed or performed in visit on 11/26/16  Microscopic Examination  Result Value Ref Range   WBC, UA 0-5 0 - 5 /hpf   RBC, UA 0-2 0 - 2 /hpf   Epithelial Cells (non renal) CANCELED    Bacteria, UA None seen None seen/Few  CBC with Differential/Platelet  Result Value Ref Range   WBC 6.1 3.4 - 10.8 x10E3/uL   RBC 4.99 4.14 - 5.80 x10E6/uL   Hemoglobin 15.8 13.0 - 17.7 g/dL   Hematocrit 47.6 37.5 - 51.0 %   MCV 95 79 - 97 fL   MCH 31.7 26.6 - 33.0 pg   MCHC 33.2 31.5 - 35.7 g/dL   RDW 13.1 12.3 - 15.4 %   Platelets 213 150 - 379  x10E3/uL   Neutrophils 56 Not Estab. %   Lymphs 28 Not Estab. %   Monocytes 15 Not Estab. %   Eos 1 Not Estab. %   Basos 0 Not Estab. %   Neutrophils Absolute 3.4 1.4 - 7.0 x10E3/uL   Lymphocytes Absolute 1.7 0.7 - 3.1 x10E3/uL   Monocytes Absolute 0.9 0.1 - 0.9 x10E3/uL   EOS (ABSOLUTE) 0.1 0.0 - 0.4 x10E3/uL   Basophils Absolute 0.0 0.0 - 0.2 x10E3/uL   Immature Granulocytes 0 Not Estab. %   Immature Grans (Abs) 0.0 0.0 - 0.1 x10E3/uL  Comprehensive metabolic panel  Result Value Ref Range   Glucose 124 (H) 65 - 99 mg/dL   BUN 10 8 - 27 mg/dL   Creatinine, Ser 0.75 (L) 0.76 - 1.27 mg/dL   GFR calc non Af Amer 99 >59 mL/min/1.73   GFR calc Af Amer 114 >59 mL/min/1.73   BUN/Creatinine Ratio 13 10 -  24   Sodium 143 134 - 144 mmol/L   Potassium 4.5 3.5 - 5.2 mmol/L   Chloride 103 96 - 106 mmol/L   CO2 26 18 - 29 mmol/L   Calcium 9.2 8.6 - 10.2 mg/dL   Total Protein 6.6 6.0 - 8.5 g/dL   Albumin 4.5 3.6 - 4.8 g/dL   Globulin, Total 2.1 1.5 - 4.5 g/dL   Albumin/Globulin Ratio 2.1 1.2 - 2.2   Bilirubin Total 0.8 0.0 - 1.2 mg/dL   Alkaline Phosphatase 75 39 - 117 IU/L   AST 22 0 - 40 IU/L   ALT 35 0 - 44 IU/L  Lipid panel  Result Value Ref Range   Cholesterol, Total 204 (H) 100 - 199 mg/dL   Triglycerides 250 (H) 0 - 149 mg/dL   HDL 30 (L) >39 mg/dL   VLDL Cholesterol Cal 50 (H) 5 - 40 mg/dL   LDL Calculated 124 (H) 0 - 99 mg/dL   Chol/HDL Ratio 6.8 (H) 0.0 - 5.0 ratio units  PSA  Result Value Ref Range   Prostate Specific Ag, Serum 2.8 0.0 - 4.0 ng/mL  TSH  Result Value Ref Range   TSH 1.170 0.450 - 4.500 uIU/mL  Urinalysis, Routine w reflex microscopic  Result Value Ref Range   Specific Gravity, UA >1.030 (H) 1.005 - 1.030   pH, UA 6.0 5.0 - 7.5   Color, UA Orange Yellow   Appearance Ur Clear Clear   Leukocytes, UA Negative Negative   Protein, UA Trace (A) Negative/Trace   Glucose, UA Negative Negative   Ketones, UA Negative Negative   RBC, UA Negative Negative   Bilirubin, UA Negative Negative   Urobilinogen, Ur 0.2 0.2 - 1.0 mg/dL   Nitrite, UA Negative Negative   Microscopic Examination See below:       Assessment & Plan:   Problem List Items Addressed This Visit      Respiratory   Obstructive sleep apnea    Doing well with CPAP use is faithfully.  Has full resolution of daytime drowsiness and no side effects.        Genitourinary   Nephrolithiasis - Primary   Relevant Orders   Comprehensive metabolic panel     Other   Insomnia    Discussed insomnia getting a little bit better with and insight for home building.      Glaucoma    Followed by eye Dr       Other Visit Diagnoses    Annual physical exam       Relevant Orders  CBC with  Differential/Platelet   Comprehensive metabolic panel   Lipid panel   PSA   TSH   Urinalysis, Routine w reflex microscopic   Essential hypertension       Relevant Orders   CBC with Differential/Platelet   Comprehensive metabolic panel   Lipid panel   Urinalysis, Routine w reflex microscopic   Prostate cancer screening       Relevant Orders   PSA   Thyroid disorder screen       Relevant Orders   TSH       Follow up plan: Return in about 1 year (around 04/01/2019).

## 2018-03-31 NOTE — Assessment & Plan Note (Signed)
Doing well with CPAP use is faithfully.  Has full resolution of daytime drowsiness and no side effects.

## 2018-04-01 ENCOUNTER — Encounter: Payer: Self-pay | Admitting: Family Medicine

## 2018-04-01 LAB — COMPREHENSIVE METABOLIC PANEL
ALBUMIN: 4.3 g/dL (ref 3.6–4.8)
ALK PHOS: 75 IU/L (ref 39–117)
ALT: 32 IU/L (ref 0–44)
AST: 19 IU/L (ref 0–40)
Albumin/Globulin Ratio: 1.7 (ref 1.2–2.2)
BUN / CREAT RATIO: 13 (ref 10–24)
BUN: 10 mg/dL (ref 8–27)
Bilirubin Total: 0.7 mg/dL (ref 0.0–1.2)
CO2: 25 mmol/L (ref 20–29)
CREATININE: 0.78 mg/dL (ref 0.76–1.27)
Calcium: 9.2 mg/dL (ref 8.6–10.2)
Chloride: 100 mmol/L (ref 96–106)
GFR calc non Af Amer: 96 mL/min/{1.73_m2} (ref >59)
GFR, EST AFRICAN AMERICAN: 111 mL/min/{1.73_m2} (ref >59)
GLOBULIN, TOTAL: 2.5 g/dL (ref 1.5–4.5)
GLUCOSE: 92 mg/dL (ref 65–99)
Potassium: 4.4 mmol/L (ref 3.5–5.2)
SODIUM: 139 mmol/L (ref 134–144)
TOTAL PROTEIN: 6.8 g/dL (ref 6.0–8.5)

## 2018-04-01 LAB — CBC WITH DIFFERENTIAL/PLATELET
Basophils Absolute: 0 10*3/uL (ref 0.0–0.2)
Basos: 0 %
EOS (ABSOLUTE): 0.1 10*3/uL (ref 0.0–0.4)
EOS: 1 %
HEMATOCRIT: 48.2 % (ref 37.5–51.0)
HEMOGLOBIN: 16.4 g/dL (ref 13.0–17.7)
IMMATURE GRANS (ABS): 0 10*3/uL (ref 0.0–0.1)
Immature Granulocytes: 0 %
LYMPHS ABS: 2.2 10*3/uL (ref 0.7–3.1)
LYMPHS: 24 %
MCH: 32.5 pg (ref 26.6–33.0)
MCHC: 34 g/dL (ref 31.5–35.7)
MCV: 95 fL (ref 79–97)
MONOCYTES: 9 %
Monocytes Absolute: 0.8 10*3/uL (ref 0.1–0.9)
NEUTROS ABS: 6.1 10*3/uL (ref 1.4–7.0)
Neutrophils: 66 %
Platelets: 217 10*3/uL (ref 150–450)
RBC: 5.05 x10E6/uL (ref 4.14–5.80)
RDW: 12.3 % (ref 12.3–15.4)
WBC: 9.3 10*3/uL (ref 3.4–10.8)

## 2018-04-01 LAB — TSH: TSH: 1.63 u[IU]/mL (ref 0.450–4.500)

## 2018-04-01 LAB — LIPID PANEL
CHOLESTEROL TOTAL: 199 mg/dL (ref 100–199)
Chol/HDL Ratio: 6.2 ratio — ABNORMAL HIGH (ref 0.0–5.0)
HDL: 32 mg/dL — ABNORMAL LOW (ref >39)
LDL CALC: 100 mg/dL — AB (ref 0–99)
Triglycerides: 335 mg/dL — ABNORMAL HIGH (ref 0–149)
VLDL CHOLESTEROL CAL: 67 mg/dL — AB (ref 5–40)

## 2018-04-01 LAB — PSA: PROSTATE SPECIFIC AG, SERUM: 3.3 ng/mL (ref 0.0–4.0)

## 2018-05-18 DIAGNOSIS — H401313 Pigmentary glaucoma, right eye, severe stage: Secondary | ICD-10-CM | POA: Diagnosis not present

## 2018-07-27 DIAGNOSIS — H401313 Pigmentary glaucoma, right eye, severe stage: Secondary | ICD-10-CM | POA: Diagnosis not present

## 2018-07-27 DIAGNOSIS — H21232 Degeneration of iris (pigmentary), left eye: Secondary | ICD-10-CM | POA: Diagnosis not present

## 2018-07-28 ENCOUNTER — Emergency Department (HOSPITAL_COMMUNITY): Payer: PPO

## 2018-07-28 ENCOUNTER — Encounter (HOSPITAL_COMMUNITY): Payer: Self-pay | Admitting: Emergency Medicine

## 2018-07-28 ENCOUNTER — Other Ambulatory Visit: Payer: Self-pay

## 2018-07-28 ENCOUNTER — Encounter (HOSPITAL_COMMUNITY)
Admission: EM | Disposition: A | Discharge status: Home / Self Care | Payer: Self-pay | Source: Home / Self Care | Attending: Cardiology

## 2018-07-28 ENCOUNTER — Inpatient Hospital Stay (HOSPITAL_COMMUNITY)
Admission: EM | Admit: 2018-07-28 | Discharge: 2018-07-29 | DRG: 247 | Disposition: A | Discharge status: Home / Self Care | Payer: PPO | Attending: Cardiology | Admitting: Cardiology

## 2018-07-28 DIAGNOSIS — Z888 Allergy status to other drugs, medicaments and biological substances status: Secondary | ICD-10-CM | POA: Diagnosis not present

## 2018-07-28 DIAGNOSIS — I200 Unstable angina: Secondary | ICD-10-CM

## 2018-07-28 DIAGNOSIS — Z8249 Family history of ischemic heart disease and other diseases of the circulatory system: Secondary | ICD-10-CM

## 2018-07-28 DIAGNOSIS — G4733 Obstructive sleep apnea (adult) (pediatric): Secondary | ICD-10-CM

## 2018-07-28 DIAGNOSIS — Z955 Presence of coronary angioplasty implant and graft: Secondary | ICD-10-CM | POA: Diagnosis not present

## 2018-07-28 DIAGNOSIS — I4510 Unspecified right bundle-branch block: Secondary | ICD-10-CM | POA: Diagnosis not present

## 2018-07-28 DIAGNOSIS — I214 Non-ST elevation (NSTEMI) myocardial infarction: Secondary | ICD-10-CM | POA: Diagnosis not present

## 2018-07-28 DIAGNOSIS — I25110 Atherosclerotic heart disease of native coronary artery with unstable angina pectoris: Secondary | ICD-10-CM | POA: Diagnosis present

## 2018-07-28 DIAGNOSIS — Z9861 Coronary angioplasty status: Secondary | ICD-10-CM | POA: Diagnosis not present

## 2018-07-28 DIAGNOSIS — I2510 Atherosclerotic heart disease of native coronary artery without angina pectoris: Secondary | ICD-10-CM

## 2018-07-28 DIAGNOSIS — Z9989 Dependence on other enabling machines and devices: Secondary | ICD-10-CM | POA: Diagnosis not present

## 2018-07-28 DIAGNOSIS — I10 Essential (primary) hypertension: Secondary | ICD-10-CM | POA: Diagnosis not present

## 2018-07-28 DIAGNOSIS — I129 Hypertensive chronic kidney disease with stage 1 through stage 4 chronic kidney disease, or unspecified chronic kidney disease: Secondary | ICD-10-CM | POA: Diagnosis not present

## 2018-07-28 DIAGNOSIS — E785 Hyperlipidemia, unspecified: Secondary | ICD-10-CM | POA: Diagnosis present

## 2018-07-28 DIAGNOSIS — H409 Unspecified glaucoma: Secondary | ICD-10-CM | POA: Diagnosis present

## 2018-07-28 DIAGNOSIS — I25700 Atherosclerosis of coronary artery bypass graft(s), unspecified, with unstable angina pectoris: Secondary | ICD-10-CM | POA: Diagnosis not present

## 2018-07-28 DIAGNOSIS — M1990 Unspecified osteoarthritis, unspecified site: Secondary | ICD-10-CM | POA: Diagnosis present

## 2018-07-28 DIAGNOSIS — Z87891 Personal history of nicotine dependence: Secondary | ICD-10-CM | POA: Diagnosis not present

## 2018-07-28 DIAGNOSIS — N189 Chronic kidney disease, unspecified: Secondary | ICD-10-CM | POA: Diagnosis not present

## 2018-07-28 DIAGNOSIS — R079 Chest pain, unspecified: Secondary | ICD-10-CM | POA: Diagnosis not present

## 2018-07-28 DIAGNOSIS — Z79899 Other long term (current) drug therapy: Secondary | ICD-10-CM | POA: Diagnosis not present

## 2018-07-28 DIAGNOSIS — I361 Nonrheumatic tricuspid (valve) insufficiency: Secondary | ICD-10-CM | POA: Diagnosis not present

## 2018-07-28 HISTORY — PX: CORONARY ANGIOPLASTY WITH STENT PLACEMENT: SHX49

## 2018-07-28 HISTORY — DX: Atherosclerotic heart disease of native coronary artery without angina pectoris: I25.10

## 2018-07-28 HISTORY — DX: Pneumonia, unspecified organism: J18.9

## 2018-07-28 HISTORY — PX: LEFT HEART CATH AND CORONARY ANGIOGRAPHY: CATH118249

## 2018-07-28 HISTORY — PX: CORONARY STENT INTERVENTION: CATH118234

## 2018-07-28 HISTORY — DX: Unspecified glaucoma: H40.9

## 2018-07-28 LAB — CBC WITH DIFFERENTIAL/PLATELET
Abs Immature Granulocytes: 0.04 10*3/uL (ref 0.00–0.07)
Basophils Absolute: 0 10*3/uL (ref 0.0–0.1)
Basophils Relative: 0 %
EOS PCT: 0 %
Eosinophils Absolute: 0 10*3/uL (ref 0.0–0.5)
HEMATOCRIT: 49.8 % (ref 39.0–52.0)
HEMOGLOBIN: 16.9 g/dL (ref 13.0–17.0)
Immature Granulocytes: 0 %
LYMPHS PCT: 17 %
Lymphs Abs: 1.7 10*3/uL (ref 0.7–4.0)
MCH: 32.1 pg (ref 26.0–34.0)
MCHC: 33.9 g/dL (ref 30.0–36.0)
MCV: 94.7 fL (ref 80.0–100.0)
Monocytes Absolute: 1.1 10*3/uL — ABNORMAL HIGH (ref 0.1–1.0)
Monocytes Relative: 11 %
NRBC: 0 % (ref 0.0–0.2)
Neutro Abs: 6.8 10*3/uL (ref 1.7–7.7)
Neutrophils Relative %: 72 %
Platelets: 201 10*3/uL (ref 150–400)
RBC: 5.26 MIL/uL (ref 4.22–5.81)
RDW: 12.1 % (ref 11.5–15.5)
WBC: 9.7 10*3/uL (ref 4.0–10.5)

## 2018-07-28 LAB — COMPREHENSIVE METABOLIC PANEL
ALT: 38 U/L (ref 0–44)
AST: 29 U/L (ref 15–41)
Albumin: 4.1 g/dL (ref 3.5–5.0)
Alkaline Phosphatase: 60 U/L (ref 38–126)
Anion gap: 8 (ref 5–15)
BUN: 10 mg/dL (ref 8–23)
CHLORIDE: 105 mmol/L (ref 98–111)
CO2: 24 mmol/L (ref 22–32)
CREATININE: 1 mg/dL (ref 0.61–1.24)
Calcium: 9.1 mg/dL (ref 8.9–10.3)
GFR calc Af Amer: >60 mL/min (ref >60)
Glucose, Bld: 108 mg/dL — ABNORMAL HIGH (ref 70–99)
POTASSIUM: 3.7 mmol/L (ref 3.5–5.1)
SODIUM: 137 mmol/L (ref 135–145)
Total Bilirubin: 1.1 mg/dL (ref 0.3–1.2)
Total Protein: 6.8 g/dL (ref 6.5–8.1)

## 2018-07-28 LAB — POCT ACTIVATED CLOTTING TIME
Activated Clotting Time: 257 seconds
Activated Clotting Time: 428 seconds
Activated Clotting Time: 169 seconds

## 2018-07-28 LAB — BRAIN NATRIURETIC PEPTIDE: B Natriuretic Peptide: 47.5 pg/mL (ref 0.0–100.0)

## 2018-07-28 LAB — PROTIME-INR
INR: 1
Prothrombin Time: 13.1 seconds (ref 11.4–15.2)

## 2018-07-28 LAB — TROPONIN I: TROPONIN I: 1.22 ng/mL — AB (ref <0.03)

## 2018-07-28 LAB — MAGNESIUM: MAGNESIUM: 2.2 mg/dL (ref 1.7–2.4)

## 2018-07-28 LAB — CBG MONITORING, ED: GLUCOSE-CAPILLARY: 121 mg/dL — AB (ref 70–99)

## 2018-07-28 SURGERY — LEFT HEART CATH AND CORONARY ANGIOGRAPHY
Anesthesia: LOCAL

## 2018-07-28 MED ORDER — HYDRALAZINE HCL 20 MG/ML IJ SOLN: 5.0000 mg | INTRAMUSCULAR | Status: AC | PRN

## 2018-07-28 MED ORDER — ACETAMINOPHEN 325 MG PO TABS
650.0000 mg | ORAL_TABLET | ORAL | Status: DC | PRN
  Administered 2018-07-28: 21:00:00 650 mg via ORAL
  Filled 2018-07-28: qty 2

## 2018-07-28 MED ORDER — HEPARIN BOLUS VIA INFUSION
4000.0000 [IU] | Freq: Once | INTRAVENOUS | Status: AC
  Administered 2018-07-28: 4000 [IU] via INTRAVENOUS
  Filled 2018-07-28: qty 4000

## 2018-07-28 MED ORDER — MIDAZOLAM HCL 2 MG/2ML IJ SOLN
INTRAMUSCULAR | Status: AC
  Filled 2018-07-28: qty 2

## 2018-07-28 MED ORDER — NITROGLYCERIN IN D5W 200-5 MCG/ML-% IV SOLN
INTRAVENOUS | Status: AC | PRN
  Administered 2018-07-28: 10 ug/min via INTRAVENOUS

## 2018-07-28 MED ORDER — TICAGRELOR 90 MG PO TABS
ORAL_TABLET | ORAL | Status: AC
  Filled 2018-07-28: qty 2

## 2018-07-28 MED ORDER — HEPARIN (PORCINE) 25000 UT/250ML-% IV SOLN
1200.0000 [IU]/h | INTRAVENOUS | Status: DC
  Administered 2018-07-28: 1200 [IU]/h via INTRAVENOUS
  Filled 2018-07-28: qty 250

## 2018-07-28 MED ORDER — ONDANSETRON HCL 4 MG/2ML IJ SOLN
4.0000 mg | Freq: Four times a day (QID) | INTRAMUSCULAR | Status: DC | PRN
  Filled 2018-07-28: qty 2

## 2018-07-28 MED ORDER — FENTANYL CITRATE (PF) 100 MCG/2ML IJ SOLN
INTRAMUSCULAR | Status: DC | PRN
  Administered 2018-07-28: 25 ug via INTRAVENOUS
  Administered 2018-07-28 (×2): 50 ug via INTRAVENOUS
  Administered 2018-07-28 (×2): 25 ug via INTRAVENOUS

## 2018-07-28 MED ORDER — SODIUM CHLORIDE 0.9% FLUSH
3.0000 mL | Freq: Two times a day (BID) | INTRAVENOUS | Status: DC
  Administered 2018-07-29: 09:00:00 3 mL via INTRAVENOUS

## 2018-07-28 MED ORDER — LIDOCAINE HCL (PF) 1 % IJ SOLN
INTRAMUSCULAR | Status: AC
  Filled 2018-07-28: qty 30

## 2018-07-28 MED ORDER — ASPIRIN 300 MG RE SUPP: 300.0000 mg | RECTAL | Status: DC

## 2018-07-28 MED ORDER — ASPIRIN 81 MG PO CHEW: 324.0000 mg | CHEWABLE_TABLET | ORAL | Status: DC

## 2018-07-28 MED ORDER — NITROGLYCERIN IN D5W 200-5 MCG/ML-% IV SOLN
INTRAVENOUS | Status: AC
  Filled 2018-07-28: qty 250

## 2018-07-28 MED ORDER — MIDAZOLAM HCL 2 MG/2ML IJ SOLN
INTRAMUSCULAR | Status: DC | PRN
  Administered 2018-07-28 (×2): 1 mg via INTRAVENOUS
  Administered 2018-07-28: 2 mg via INTRAVENOUS

## 2018-07-28 MED ORDER — LABETALOL HCL 5 MG/ML IV SOLN: 10.0000 mg | INTRAVENOUS | Status: AC | PRN

## 2018-07-28 MED ORDER — TICAGRELOR 90 MG PO TABS
ORAL_TABLET | ORAL | Status: DC | PRN
  Administered 2018-07-28: 180 mg via ORAL

## 2018-07-28 MED ORDER — ANGIOPLASTY BOOK
Freq: Once | Status: AC
  Administered 2018-07-28: 22:00:00 1
  Filled 2018-07-28: qty 1

## 2018-07-28 MED ORDER — LIDOCAINE HCL (PF) 1 % IJ SOLN
INTRAMUSCULAR | Status: DC | PRN
  Administered 2018-07-28: 15 mL via INTRADERMAL
  Administered 2018-07-28: 5 mL via INTRADERMAL

## 2018-07-28 MED ORDER — ASPIRIN 81 MG PO CHEW
324.0000 mg | CHEWABLE_TABLET | Freq: Once | ORAL | Status: AC
  Administered 2018-07-28: 324 mg via ORAL
  Filled 2018-07-28: qty 4

## 2018-07-28 MED ORDER — HEPARIN (PORCINE) IN NACL 1000-0.9 UT/500ML-% IV SOLN
INTRAVENOUS | Status: DC | PRN
  Administered 2018-07-28 (×2): 500 mL

## 2018-07-28 MED ORDER — VERAPAMIL HCL 2.5 MG/ML IV SOLN
INTRAVENOUS | Status: AC
  Filled 2018-07-28: qty 2

## 2018-07-28 MED ORDER — VERAPAMIL HCL 2.5 MG/ML IV SOLN
INTRAVENOUS | Status: DC | PRN
  Administered 2018-07-28: 10 mL via INTRA_ARTERIAL

## 2018-07-28 MED ORDER — ATORVASTATIN CALCIUM 80 MG PO TABS
80.0000 mg | ORAL_TABLET | Freq: Every day | ORAL | Status: DC
  Administered 2018-07-28: 80 mg via ORAL
  Filled 2018-07-28: qty 1

## 2018-07-28 MED ORDER — FENTANYL CITRATE (PF) 100 MCG/2ML IJ SOLN
INTRAMUSCULAR | Status: AC
  Filled 2018-07-28: qty 2

## 2018-07-28 MED ORDER — HEPARIN SODIUM (PORCINE) 1000 UNIT/ML IJ SOLN
INTRAMUSCULAR | Status: DC | PRN
  Administered 2018-07-28: 5500 [IU] via INTRAVENOUS
  Administered 2018-07-28: 4500 [IU] via INTRAVENOUS
  Administered 2018-07-28: 3000 [IU] via INTRAVENOUS

## 2018-07-28 MED ORDER — SODIUM CHLORIDE 0.9% FLUSH: 3.0000 mL | INTRAVENOUS | Status: DC | PRN

## 2018-07-28 MED ORDER — THE SENSUOUS HEART BOOK
Freq: Once | Status: AC
  Administered 2018-07-28: 1
  Filled 2018-07-28: qty 1

## 2018-07-28 MED ORDER — LATANOPROST 0.005 % OP SOLN
1.0000 [drp] | Freq: Every day | OPHTHALMIC | Status: DC
  Filled 2018-07-28: qty 2.5

## 2018-07-28 MED ORDER — HEPARIN (PORCINE) IN NACL 1000-0.9 UT/500ML-% IV SOLN
INTRAVENOUS | Status: AC
  Filled 2018-07-28: qty 1000

## 2018-07-28 MED ORDER — SODIUM CHLORIDE 0.9 % IV SOLN
INTRAVENOUS | Status: AC
  Administered 2018-07-28: 18:00:00 via INTRAVENOUS

## 2018-07-28 MED ORDER — ASPIRIN EC 81 MG PO TBEC
81.0000 mg | DELAYED_RELEASE_TABLET | Freq: Every day | ORAL | Status: DC
  Administered 2018-07-29: 81 mg via ORAL
  Filled 2018-07-28: qty 1

## 2018-07-28 MED ORDER — HEART ATTACK BOUNCING BOOK
Freq: Once | Status: AC
  Administered 2018-07-28: 01:00:00 1
  Filled 2018-07-28: qty 1

## 2018-07-28 MED ORDER — ENSURE ENLIVE PO LIQD
237.0000 mL | Freq: Two times a day (BID) | ORAL | Status: DC
  Filled 2018-07-28 (×2): qty 237

## 2018-07-28 MED ORDER — NITROGLYCERIN IN D5W 200-5 MCG/ML-% IV SOLN
0.0000 ug/min | INTRAVENOUS | Status: DC
  Administered 2018-07-28: 18:00:00 10 ug/min via INTRAVENOUS

## 2018-07-28 MED ORDER — NITROGLYCERIN 0.4 MG SL SUBL: 0.4000 mg | SUBLINGUAL_TABLET | SUBLINGUAL | Status: DC | PRN

## 2018-07-28 MED ORDER — TICAGRELOR 90 MG PO TABS
90.0000 mg | ORAL_TABLET | Freq: Two times a day (BID) | ORAL | Status: DC
  Administered 2018-07-29 (×2): 90 mg via ORAL
  Filled 2018-07-28 (×2): qty 1

## 2018-07-28 MED ORDER — SODIUM CHLORIDE 0.9 % IV SOLN: 250.0000 mL | INTRAVENOUS | Status: DC | PRN

## 2018-07-28 SURGICAL SUPPLY — 25 items
BALLN SAPPHIRE 2.0X12 (BALLOONS) ×2
BALLN SAPPHIRE ~~LOC~~ 2.75X12 (BALLOONS) ×2 IMPLANT
BALLOON SAPPHIRE 2.0X12 (BALLOONS) ×1 IMPLANT
CATH 5FR JL3.5 JR4 ANG PIG MP (CATHETERS) ×2 IMPLANT
CATH INFINITI 5 FR 3DRC (CATHETERS) ×2 IMPLANT
CATH LAUNCHER 6FR AL1 (CATHETERS) ×1 IMPLANT
CATH LAUNCHER 6FR AR1 (CATHETERS) ×2 IMPLANT
CATH LAUNCHER 6FR JR4 (CATHETERS) ×2 IMPLANT
CATHETER LAUNCHER 6FR AL1 (CATHETERS) ×2
DEVICE RAD COMP TR BAND LRG (VASCULAR PRODUCTS) ×2 IMPLANT
GLIDESHEATH SLEND SS 6F .021 (SHEATH) ×2 IMPLANT
GUIDEWIRE INQWIRE 1.5J.035X260 (WIRE) ×1 IMPLANT
INQWIRE 1.5J .035X260CM (WIRE) ×2
KIT ENCORE 26 ADVANTAGE (KITS) ×2 IMPLANT
KIT HEART LEFT (KITS) ×2 IMPLANT
KIT MICROPUNCTURE NIT STIFF (SHEATH) ×2 IMPLANT
PACK CARDIAC CATHETERIZATION (CUSTOM PROCEDURE TRAY) ×2 IMPLANT
SHEATH PINNACLE 6F 10CM (SHEATH) ×2 IMPLANT
STENT SYNERGY DES 2.5X20 (Permanent Stent) ×2 IMPLANT
SYR MEDRAD MARK 7 150ML (SYRINGE) ×2 IMPLANT
TRANSDUCER W/STOPCOCK (MISCELLANEOUS) ×2 IMPLANT
TUBING CIL FLEX 10 FLL-RA (TUBING) ×2 IMPLANT
WIRE COUGAR XT STRL 190CM (WIRE) ×2 IMPLANT
WIRE EMERALD 3MM-J .035X150CM (WIRE) ×2 IMPLANT
WIRE EMERALD ST .035X150CM (WIRE) ×2 IMPLANT

## 2018-07-28 NOTE — ED Provider Notes (Signed)
[facility] EMERGENCY DEPARTMENT Provider Note   CSN: [id number] Arrival date & time: 07/28/18  0944     History   Chief Complaint Chief Complaint  Patient presents with  . Chest Pain    HPI Steve Howell is a 62 y.o. male.  HPI Presents after an episode of chest pain, syncope. Episode was actually 2 days ago.  Patient recalls walking up a hill, caring heavy items, feeling chest tightness across the superior thorax, having numbness in his arms, dizziness, and then suffered an episode of syncope. Upon awakening he had no additional chest pain, had no numbness, and has had no additional episodes of pain, nor syncope since that time. Initially the patient states that this is the first time this type of thing has occurred, but eventually patient notes that he had a milder episode a few weeks ago. Patient denies substantial medical problems.  Past Medical History:  Diagnosis Date  . Arthritis    knees  . Borderline glaucoma    RIGHT EYE  . Cataract   . Chronic kidney disease   . GERD (gastroesophageal reflux disease)   . History of kidney stones   . Hypertension    hx of, no medications needed now  . Sleep apnea    wears CPAP  . Wears glasses     Patient Active Problem List   Diagnosis Date Noted  . Glaucoma 11/26/2016  . Insomnia 10/02/2016  . Allergic rhinitis 06/26/2015  . Obstructive sleep apnea 06/26/2015  . Nephrolithiasis 06/26/2015    Past Surgical History:  Procedure Laterality Date  . CATARACT EXTRACTION W/ INTRAOCULAR LENS IMPLANT Right 2015  . COLONOSCOPY    . CYSTOSCOPY WITH RETROGRADE PYELOGRAM, URETEROSCOPY AND STENT PLACEMENT Bilateral 07/08/2014   Procedure: CYSTOSCOPY WITH BILATERAL RETROGRADE PYELOGRAM, right STENT PLACEMENT;  Surgeon: Festus Aloe, MD;  Location: [facility];  Service: Urology;  Laterality: Bilateral;  . CYSTOSCOPY WITH URETEROSCOPY AND STENT PLACEMENT Right 07/29/2014   Procedure:  CYSTO  WITH RIGHT URETEROSCOPY/LITHOTRIPSY/STENT rePLACEMENT;  Surgeon: Festus Aloe, MD;  Location[facility];  Service: Urology;  Laterality: Right;  . GLAUCOMA SURGERY Right   . HOLMIUM LASER APPLICATION Right 40/98/1191   Procedure: HOLMIUM LASER APPLICATION;  Surgeon: Festus Aloe, MD;  Location: [facility];  Service: Urology;  Laterality: Right;  . LUMBAR DISC SURGERY  2000   L4-5  . PHOTOCOAGULATION WITH LASER Right 11/04/2016   Procedure: transcleral diode cyclophotocoagulation;  Surgeon: Ronnell Freshwater, MD;  Location: [facility] [place];  Service: Ophthalmology;  Laterality: Right;  . RETINAL DETACHMENT SURGERY  04-04-2014  . TONSILLECTOMY          Home Medications    Prior to Admission medications   Medication Sig Start Date End Date Taking? Authorizing Provider  doxycycline (VIBRA-TABS) 100 MG tablet Take 1 tablet (100 mg total) by mouth 2 (two) times daily. 03/31/18   Guadalupe Maple, MD  LATANOPROST OP Apply 1 drop to eye. Right eye each night    [provider]    Family History Family History  Problem Relation Age of Onset  . Colon cancer Neg Hx   . Esophageal cancer Neg Hx   . Rectal cancer Neg Hx   . Stomach cancer Neg Hx     Social History Social History   Tobacco Use  . Smoking status: Former Smoker    Packs/day: 1.00    Years: 20.00    Pack years: 20.00  Types: Cigarettes    Last attempt to quit: 07/01/1994    Years since quitting: 24.0  . Smokeless tobacco: Never Used  Substance Use Topics  . Alcohol use: Yes    Alcohol/week: 17.0 standard drinks    Types: 10 Cans of beer, 7 Shots of liquor per week    Comment: daily  . Drug use: No     Allergies   Acetazolamide; Brimonidine tartrate-timolol; Other; and Brinzolamide-brimonidine   Review of Systems Review of Systems  Constitutional:       Per HPI, otherwise negative  HENT:       Per HPI, otherwise negative  Respiratory:        Per HPI, otherwise negative  Cardiovascular:       Per HPI, otherwise negative  Gastrointestinal: Negative for vomiting.  Endocrine:       Negative aside from HPI  Genitourinary:       Neg aside from HPI   Musculoskeletal:       Per HPI, otherwise negative  Skin: Negative.   Neurological: Positive for syncope.     Physical Exam Updated Vital Signs BP (!) 154/97   Pulse 74   Temp 98.3 F (36.8 C) (Oral)   Resp 15   Ht 5\' 11"  (1.803 m)   Wt 93 kg   SpO2 98%   BMI 28.60 kg/m   Physical Exam  Constitutional: He is oriented to person, place, and time. He appears well-developed. No distress.  HENT:  Head: Normocephalic and atraumatic.  Eyes: Conjunctivae and EOM are normal.  Cardiovascular: Normal rate and regular rhythm.  Pulmonary/Chest: Effort normal. No stridor. No respiratory distress.  Abdominal: He exhibits no distension.  Musculoskeletal: He exhibits no edema.  Neurological: He is alert and oriented to person, place, and time.  Skin: Skin is warm and dry.  Psychiatric: He has a normal mood and affect.  Nursing note and vitals reviewed.    ED Treatments / Results  Labs (all labs ordered are listed, but only abnormal results are displayed) Labs Reviewed  COMPREHENSIVE METABOLIC PANEL - Abnormal; Notable for the following components:      Result Value   Glucose, Bld 108 (*)    All other components within normal limits  TROPONIN I - Abnormal; Notable for the following components:   Troponin I 1.22 (*)    All other components within normal limits  CBC WITH DIFFERENTIAL/PLATELET - Abnormal; Notable for the following components:   Monocytes Absolute 1.1 (*)    All other components within normal limits  CBG MONITORING, ED - Abnormal; Notable for the following components:   Glucose-Capillary 121 (*)    All other components within normal limits  MAGNESIUM  BRAIN NATRIURETIC PEPTIDE  PROTIME-INR  HEPARIN LEVEL (UNFRACTIONATED)    EKG EKG  Interpretation  Date/Time:  Tuesday July 28 2018 09:55:56 EST Ventricular Rate:  78 PR Interval:    QRS Duration: 112 QT Interval:  381 QTC Calculation: 434 R Axis:   83 Text Interpretation:  Sinus rhythm Incomplete right bundle branch block Baseline wander in lead(s) V3 Abnormal ekg Confirmed by Carmin Muskrat [phone]) on 07/28/2018 10:05:45 AM   Radiology Dg Chest Portable 1 View  Result Date: 07/28/2018 CLINICAL DATA:  Chest pain EXAM: PORTABLE CHEST 1 VIEW COMPARISON:  None. FINDINGS: Heart and mediastinal contours are within normal limits. No focal opacities or effusions. No acute bony abnormality. IMPRESSION: No active disease. Electronically Signed   By: Rolm Baptise M.D.   On: 07/28/2018 10:48  Procedures Procedures (including critical care time)  Medications Ordered in ED Medications  heparin bolus via infusion 4,000 Units (has no administration in time range)  heparin ADULT infusion 100 units/mL (25000 units/244mL sodium chloride 0.45%) (has no administration in time range)  aspirin chewable tablet 324 mg (324 mg Oral Given 07/28/18 1018)     Initial Impression / Assessment and Plan / ED Course  I have reviewed the triage vital signs and the nursing notes.  Pertinent labs & imaging results that were available during my care of the patient were reviewed by me and considered in my medical decision making (see chart for details).    Update: Labs notable for troponin 1.22 On repeat exam the patient is in no distress.  We discussed this finding, with concern for possible cardiac event during the patient's episode of chest pain, syncope, the patient will start a heparin drip.  Patient's case discussed with our cardiology colleagues, and given these concerns, patient required admission for further evaluation and management.  (Next a chart completion, signing) patient admitted to the cardiology team, and had catheterization, with results below:   1. Severe single  vessel CAD. Severe stenosis distal RCA.  2. Successful PTCA/DES x 1 distal RCA 3. Mild non-obstructive disease in the LAD and Circumflex 4. Normal LV systolic function   Final Clinical Impressions(s) / ED Diagnoses  Chest pain Elevated troponin  CRITICAL CARE Performed by: Carmin Muskrat Total critical care time: 35 minutes Critical care time was exclusive of separately billable procedures and treating other patients. Critical care was necessary to treat or prevent imminent or life-threatening deterioration. Critical care was time spent personally by me on the following activities: development of treatment plan with patient and/or surrogate as well as nursing, discussions with consultants, evaluation of patient's response to treatment, examination of patient, obtaining history from patient or surrogate, ordering and performing treatments and interventions, ordering and review of laboratory studies, ordering and review of radiographic studies, pulse oximetry and re-evaluation of patient's condition.    Carmin Muskrat, MD 07/29/18 [phone]

## 2018-07-28 NOTE — Progress Notes (Signed)
ANTICOAGULATION CONSULT NOTE - Initial Consult  Pharmacy Consult for heparin Indication: chest pain/ACS  Allergies  Allergen Reactions  . Acetazolamide Other (See Comments)    CAUSED RENAL COLIC Other reaction(s): Other (See Comments) CAUSED RENAL COLIC  . Brimonidine Tartrate-Timolol Itching    Redness in eyes  . Other     Pt is a steroid responder Increases eye pressure- hx of glaucoma  . Brinzolamide-Brimonidine Rash    Facial numbness Other reaction(s): Other (See Comments) Facial numbness    Patient Measurements: Height: 5\' 11"  (180.3 cm) Weight: 205 lb 0.4 oz (93 kg) IBW/kg (Calculated) : 75.3 Heparin Dosing Weight: 93kg  Vital Signs: Temp: 98.3 F (36.8 C) (11/19 0954) Temp Source: Oral (11/19 0954) BP: 154/97 (11/19 1030) Pulse Rate: 74 (11/19 1030)  Labs: Recent Labs    07/28/18 1012  HGB 16.9  HCT 49.8  PLT 201  LABPROT 13.1  INR 1.00  CREATININE 1.00  TROPONINI 1.22*    Estimated Creatinine Clearance: 88.1 mL/min (by C-G formula based on SCr of 1 mg/dL).   Medical History: Past Medical History:  Diagnosis Date  . Arthritis    knees  . Borderline glaucoma    RIGHT EYE  . Cataract   . Chronic kidney disease   . GERD (gastroesophageal reflux disease)   . History of kidney stones   . Hypertension    hx of, no medications needed now  . Sleep apnea    wears CPAP  . Wears glasses     Medications:  Infusions:  . heparin      Assessment: 11 yof presented to the ED with CP. Troponin found to be elevated and now starting IV heparin. Baseline CBC is WNL and pt is not on anticoagulation PTA.   Goal of Therapy:  Heparin level 0.3-0.7 units/ml Monitor platelets by anticoagulation protocol: Yes   Plan:  Heparin bolus 4000 units IV x 1 Heparin gtt 1200 units/hr Check a 6 hr heparin level Daily heparin level and CBC  Hamdi Kley, Rande Lawman 07/28/2018,12:07 PM

## 2018-07-28 NOTE — Progress Notes (Signed)
Site area: right groin  Site Prior to Removal:  Level 1 (Bruise)  Pressure Applied For 20 MINUTES    Minutes Beginning at 20:00  Manual:   Yes.    Patient Status During Pull:  Pt vagaled  Post Pull Groin Site:  Level 1 (Bruise), soft  Post Pull Instructions Given:  Yes.    Post Pull Pulses Present:  Yes.    Dressing Applied:  Yes.    Comments:  Pt has vagal reaction 5 mins after sheath pull resolved with NS 200 ml bolus. Currently V/S stable.

## 2018-07-28 NOTE — Interval H&P Note (Signed)
History and Physical Interval Note:  07/28/2018 4:06 PM  Steve Howell  has presented today for surgery, with the diagnosis of nstemi/cp  The various methods of treatment have been discussed with the patient and family. After consideration of risks, benefits and other options for treatment, the patient has consented to  Procedure(s): LEFT HEART CATH AND CORONARY ANGIOGRAPHY (N/A) as a surgical intervention .  The patient's history has been reviewed, patient examined, no change in status, stable for surgery.  I have reviewed the patient's chart and labs.  Questions were answered to the patient's satisfaction.    Cath Lab Visit (complete for each Cath Lab visit)  Clinical Evaluation Leading to the Procedure:   ACS: Yes.    Non-ACS:    Anginal Classification: CCS III  Anti-ischemic medical therapy: No Therapy  Non-Invasive Test Results: No non-invasive testing performed  Prior CABG: No previous CABG         Lauree Chandler

## 2018-07-28 NOTE — ED Notes (Signed)
Consent signed for cardiac cath

## 2018-07-28 NOTE — H&P (View-Only) (Signed)
Cardiology Consultation:   Patient ID: [name] MRN: [id number]; DOB: Oct 07, 1955  Admit date: 07/28/2018 Date of Consult: 07/28/2018  Primary Care Provider: Guadalupe Maple, MD Primary Cardiologist: No primary care provider on file.  Primary Electrophysiologist:  None    Patient Profile:   Steve Howell is a 62 y.o. male with a hx of hypertension not on any antihypertensive medications, nephrolithiasis, OSA on CPAP without any prior cardiac history who is being seen today for the evaluation of chest pain at the request of Dr. Vanita Panda.  History of Present Illness:   Steve Howell is a 62 year old male with history of  with a hx of hypertension not on any antihypertensive medications, nephrolithiasis, OSA on CPAP without any prior cardiac history who presented after an episode of chest pain that occurred 3 days ago (05/25/18).  The patient describes the chest pain as severe in nature, present across his chest, constantly present for 30 minutes to 1 hour, accompanied with dyspnea and numbness and tingling in his upper extremities.  Patient states that the chest pain occurred after he was working on the trim in his house and he walked up a steep incline.  The patient states that he had another similar episode that occurred a week ago but he does not recall the particular symptoms that he experienced at that time.  Initial EKG showed inverted T waves in V1 to V3, right bundle branch block.  Patient has a troponin elevation of 1.22.  BNP is normal at 47.  Chest x-ray does not show any acute cardiopulmonary abnormalities.   Patient's ACS risk factors include hypertension and previous history of tobacco use.  Patient has not had any prior cardiac work-up.  Family history of grandfather with heart attack in late 53s to early 6s. The patient states that he does not know that he has hypertension and he is not on any anti-hypertensive agents currently.   Past Medical History:  Diagnosis Date    . Arthritis    knees  . Borderline glaucoma    RIGHT EYE  . Cataract   . Chronic kidney disease   . GERD (gastroesophageal reflux disease)   . History of kidney stones   . Hypertension    hx of, no medications needed now  . Sleep apnea    wears CPAP  . Wears glasses     Past Surgical History:  Procedure Laterality Date  . CATARACT EXTRACTION W/ INTRAOCULAR LENS IMPLANT Right 2015  . COLONOSCOPY    . CYSTOSCOPY WITH RETROGRADE PYELOGRAM, URETEROSCOPY AND STENT PLACEMENT Bilateral 07/08/2014   Procedure: CYSTOSCOPY WITH BILATERAL RETROGRADE PYELOGRAM, right STENT PLACEMENT;  Surgeon: Festus Aloe, MD;  Location: [facility];  Service: Urology;  Laterality: Bilateral;  . CYSTOSCOPY WITH URETEROSCOPY AND STENT PLACEMENT Right 07/29/2014   Procedure: CYSTO  WITH RIGHT URETEROSCOPY/LITHOTRIPSY/STENT rePLACEMENT;  Surgeon: Festus Aloe, MD;  Location: [facility];  Service: Urology;  Laterality: Right;  . GLAUCOMA SURGERY Right   . HOLMIUM LASER APPLICATION Right 86/76/7209   Procedure: HOLMIUM LASER APPLICATION;  Surgeon: Festus Aloe, MD;  Location: [facility];  Service: Urology;  Laterality: Right;  . LUMBAR DISC SURGERY  2000   L4-5  . PHOTOCOAGULATION WITH LASER Right 11/04/2016   Procedure: transcleral diode cyclophotocoagulation;  Surgeon: Ronnell Freshwater, MD;  Location: [place];  Service: Ophthalmology;  Laterality: Right;  . RETINAL DETACHMENT SURGERY  04-04-2014  . TONSILLECTOMY       Home Medications:  Prior to Admission medications   Medication Sig Start Date End Date Taking? Authorizing Provider  doxycycline (VIBRA-TABS) 100 MG tablet Take 1 tablet (100 mg total) by mouth 2 (two) times daily. 03/31/18   Steve Maple, MD  LATANOPROST OP Apply 1 drop to eye. Right eye each night    [provider]    Inpatient Medications: Scheduled Meds:  Continuous Infusions: . heparin 1,200  Units/hr (07/28/18 1226)   PRN Meds:   Allergies:    Allergies  Allergen Reactions  . Acetazolamide Other (See Comments)    CAUSED RENAL COLIC Other reaction(s): Other (See Comments) CAUSED RENAL COLIC  . Brimonidine Tartrate-Timolol Itching    Redness in eyes  . Other     Pt is a steroid responder Increases eye pressure- hx of glaucoma  . Brinzolamide-Brimonidine Rash    Facial numbness Other reaction(s): Other (See Comments) Facial numbness    Social History:   Social History   Socioeconomic History  . Marital status: Married    Spouse name: Not on file  . Number of children: Not on file  . Years of education: Not on file  . Highest education level: Not on file  Occupational History  . Not on file  Social Needs  . Financial resource strain: Not on file  . Food insecurity:    Worry: Not on file    Inability: Not on file  . Transportation needs:    Medical: Not on file    Non-medical: Not on file  Tobacco Use  . Smoking status: Former Smoker    Packs/day: 1.00    Years: 20.00    Pack years: 20.00    Types: Cigarettes    Last attempt to quit: 07/01/1994    Years since quitting: 24.0  . Smokeless tobacco: Never Used  Substance and Sexual Activity  . Alcohol use: Yes    Alcohol/week: 17.0 standard drinks    Types: 10 Cans of beer, 7 Shots of liquor per week    Comment: daily  . Drug use: No  . Sexual activity: Not on file  Lifestyle  . Physical activity:    Days per week: Not on file    Minutes per session: Not on file  . Stress: Not on file  Relationships  . Social connections:    Talks on phone: Not on file    Gets together: Not on file    Attends religious service: Not on file    Active member of club or organization: Not on file    Attends meetings of clubs or organizations: Not on file    Relationship status: Not on file  . Intimate partner violence:    Fear of current or ex partner: Not on file    Emotionally abused: Not on file     Physically abused: Not on file    Forced sexual activity: Not on file  Other Topics Concern  . Not on file  Social History Narrative  . Not on file    Family History:    Family History  Problem Relation Age of Onset  . Colon cancer Neg Hx   . Esophageal cancer Neg Hx   . Rectal cancer Neg Hx   . Stomach cancer Neg Hx      ROS:  Please see the history of present illness.   All other ROS reviewed and negative.     Physical Exam/Data:   Vitals:   07/28/18 1015 07/28/18 1030 07/28/18 1200 07/28/18 1228  BP: Marland Kitchen)  156/96 (!) 154/97  (!) 142/99  Pulse: 72 74  75  Resp: 19 15  12   Temp:      TempSrc:      SpO2: 96% 98%  98%  Weight:   93 kg   Height:   5\' 11"  (1.803 m)    No intake or output data in the 24 hours ending 07/28/18 1330 Filed Weights   07/28/18 1200  Weight: 93 kg   Body mass index is 28.6 kg/m.   Physical Exam  Constitutional: Appears well-developed and well-nourished. No distress.  HENT:  Head: Normocephalic and atraumatic.  Eyes: Conjunctivae are normal.  Cardiovascular: Normal rate, regular rhythm and normal heart sounds.  Respiratory: Effort normal and breath sounds normal. No respiratory distress. No wheezes.  GI: Soft. Bowel sounds are normal. No distension. There is no tenderness.  Musculoskeletal: No edema.  Neurological: Is alert.  Skin: Not diaphoretic. No erythema.  Psychiatric: Normal mood and affect. Behavior is normal. Judgment and thought content normal.   EKG:  The EKG was personally reviewed and demonstrates: Sinus rhythm, T wave inversions and v1-v3, rbbb  Telemetry:  Telemetry was personally reviewed and demonstrates: Normal sinus rhythm  Relevant CV Studies:  None  Laboratory Data:  Chemistry Recent Labs  Lab 07/28/18 1012  NA 137  K 3.7  CL 105  CO2 24  GLUCOSE 108*  BUN 10  CREATININE 1.00  CALCIUM 9.1  GFRNONAA >60  GFRAA >60  ANIONGAP 8    Recent Labs  Lab 07/28/18 1012  PROT 6.8  ALBUMIN 4.1  AST 29    ALT 38  ALKPHOS 60  BILITOT 1.1   Hematology Recent Labs  Lab 07/28/18 1012  WBC 9.7  RBC 5.26  HGB 16.9  HCT 49.8  MCV 94.7  MCH 32.1  MCHC 33.9  RDW 12.1  PLT 201   Cardiac Enzymes Recent Labs  Lab 07/28/18 1012  TROPONINI 1.22*   No results for input(s): TROPIPOC in the last 168 hours.  BNP Recent Labs  Lab 07/28/18 1012  BNP 47.5    DDimer No results for input(s): DDIMER in the last 168 hours.  Radiology/Studies:  Dg Chest Portable 1 View  Result Date: 07/28/2018 CLINICAL DATA:  Chest pain EXAM: PORTABLE CHEST 1 VIEW COMPARISON:  None. FINDINGS: Heart and mediastinal contours are within normal limits. No focal opacities or effusions. No acute bony abnormality. IMPRESSION: No active disease. Electronically Signed   By: Rolm Baptise M.D.   On: 07/28/2018 10:48    Assessment and Plan:   Unstable angina Patient presented with 2 episodes of chest pain with accompanied dyspnea and numbness in his upper extremities. Most recent chest pain was 3 days ago that occurred with exertion. EKG with T wave inversions in V1 to V3 along with troponin elevation to 1.22.   Patient high risk for ischemic event. Plan for cath today 07/28/18.   -Trend troponin to peak -Continue heparin drip -EKG in a.m. 07/29/2018 -Echo ordered -Fasting lipid panel in am  -A1C ordered  -Nitorglycerin 0.4mg  q60min prn  -Aspirin   Hypertension Patient's blood pressure has been ranging 140-180/90-100s with rates ranging 70 to 80s.  -will start metoprolol after cath       Signed, Lars Mage, MD  07/28/2018 1:30 PM   I have seen, examined and evaluated the patient this PM along with Dr. Myrene Buddy MDP.  After reviewing all the available data and chart, we discussed the patients laboratory, study & physical findings as well as  symptoms in detail. I agree with her findings, examination as well as impression recommendations as per our discussion.    Steve Howell is a very pleasant  61 year old gentleman with history of OSA on CPAP as well as probably hypertension but not who was seen in the emergency room for evaluation of chest pain Vanita Panda.  He has had now 2 prolonged episodes of chest pain one being roughly a week and half ago the past Saturday.  He felt pretty bad towards the day of Saturday and Sunday post symptoms.  He has been working in a remote property doing Psychologist, counselling. just walked up a hill rise and had significant chest pain diaphoresis and dyspnea lasting about half an hour's most recent occurrence.  She essentially self medicated with tequila and went to bed at night.  Felt a bit weak on Sunday and then when he got back to close to urethra he felt concerned enough about his symptoms and came into the emergency room.  Upon evaluation here, his EKG is really not but his troponin is 1.12.  I suspect that he has had an out of hospital MI and if his troponin levels are on the way down.  He is not currently having chest pain, however I think is not unreasonable to proceed with cardiac catheterization to exclude multivessel CAD or unstable plaque that is still not occluded.  If he does have an occluded vessel, by the OAT trial, recommendation would be to simply treat medically, however if the vessel is not PCI would probably.  We will start beta-blocker post-cath.  Also start statin aspirin Plavix for elevated troponin.  He will need to be on his home CPAP and glaucoma medications...   Performing MD: Lauree Chandler, MD or Peter Martinique, M.D., M.S.  Procedure:  LEFT HEART CATHETERIZATION WITH NEGATIVE CORONARY ANGIOGRAPHY AND POSSIBLE PERCUTANEOUS CORONARY INTERVENTION  The procedure with Risks/Benefits/Alternatives and Indications was reviewed with the patient.  All questions were answered.    Risks / Complications include, but not limited to: Death, MI, CVA/TIA, VF/VT (with defibrillation), Bradycardia (need for temporary pacer placement), contrast induced  nephropathy, bleeding / bruising / hematoma / pseudoaneurysm, vascular or coronary injury (with possible emergent CT or Vascular Surgery), adverse medication reactions, infection.  Additional risks involving the use of radiation with the possibility of radiation burns and cancer were explained in detail.  The patient voices understanding and agree to proceed.       Glenetta Hew, M.D., M.S. Interventional Cardiologist   Pager # [phone] Phone # [phone] [address] [place], [place] [address]   For questions or updates, please contact [place] Please consult [email or web address] for contact info under

## 2018-07-28 NOTE — ED Triage Notes (Signed)
Pt reports having CP, numbness, diaphoresis, dizziness, and tingling in hands on Saturday. States he may have passed out for 3 hours. States no pain now but the pain lasted about 45 mins. States he has been under lots of stress and the pain started after a hike on Saturday.

## 2018-07-28 NOTE — Consult Note (Addendum)
Cardiology Consultation:   Patient ID: Steve Howell MRN: [id number]; DOB: Dec 25, 1955  Admit date: 07/28/2018 Date of Consult: 07/28/2018  Primary Care Provider: Guadalupe Maple, MD Primary Cardiologist: No primary care provider on file.  Primary Electrophysiologist:  None    Patient Profile:   Steve Howell is a 62 y.o. male with a hx of hypertension not on any antihypertensive medications, nephrolithiasis, OSA on CPAP without any prior cardiac history who is being seen today for the evaluation of chest pain at the request of Dr. Vanita Panda.  History of Present Illness:   Steve Howell is a 63 year old male with history of  with a hx of hypertension not on any antihypertensive medications, nephrolithiasis, OSA on CPAP without any prior cardiac history who presented after an episode of chest pain that occurred 3 days ago (05/25/18).  The patient describes the chest pain as severe in nature, present across his chest, constantly present for 30 minutes to 1 hour, accompanied with dyspnea and numbness and tingling in his upper extremities.  Patient states that the chest pain occurred after he was working on the trim in his house and he walked up a steep incline.  The patient states that he had another similar episode that occurred a week ago but he does not recall the particular symptoms that he experienced at that time.  Initial EKG showed inverted T waves in V1 to V3, right bundle branch block.  Patient has a troponin elevation of 1.22.  BNP is normal at 47.  Chest x-ray does not show any acute cardiopulmonary abnormalities.   Patient's ACS risk factors include hypertension and previous history of tobacco use.  Patient has not had any prior cardiac work-up.  Family history of grandfather with heart attack in late 70s to early 40s. The patient states that he does not know that he has hypertension and he is not on any anti-hypertensive agents currently.   Past Medical History:  Diagnosis Date    . Arthritis    knees  . Borderline glaucoma    RIGHT EYE  . Cataract   . Chronic kidney disease   . GERD (gastroesophageal reflux disease)   . History of kidney stones   . Hypertension    hx of, no medications needed now  . Sleep apnea    wears CPAP  . Wears glasses     Past Surgical History:  Procedure Laterality Date  . CATARACT EXTRACTION W/ INTRAOCULAR LENS IMPLANT Right 2015  . COLONOSCOPY    . CYSTOSCOPY WITH RETROGRADE PYELOGRAM, URETEROSCOPY AND STENT PLACEMENT Bilateral 07/08/2014   Procedure: CYSTOSCOPY WITH BILATERAL RETROGRADE PYELOGRAM, right STENT PLACEMENT;  Surgeon: Festus Aloe, MD;  Location: [facility];  Service: Urology;  Laterality: Bilateral;  . CYSTOSCOPY WITH URETEROSCOPY AND STENT PLACEMENT Right 07/29/2014   Procedure: CYSTO  WITH RIGHT URETEROSCOPY/LITHOTRIPSY/STENT rePLACEMENT;  Surgeon: Festus Aloe, MD;  Location: [facility];  Service: Urology;  Laterality: Right;  . GLAUCOMA SURGERY Right   . HOLMIUM LASER APPLICATION Right 18/29/9371   Procedure: HOLMIUM LASER APPLICATION;  Surgeon: Festus Aloe, MD;  Location: [facility];  Service: Urology;  Laterality: Right;  . LUMBAR DISC SURGERY  2000   L4-5  . PHOTOCOAGULATION WITH LASER Right 11/04/2016   Procedure: transcleral diode cyclophotocoagulation;  Surgeon: Ronnell Freshwater, MD;  Location: [facility];  Service: Ophthalmology;  Laterality: Right;  . RETINAL DETACHMENT SURGERY  04-04-2014  . TONSILLECTOMY       Home Medications:  Prior to Admission medications   Medication Sig Start Date End Date Taking? Authorizing Provider  doxycycline (VIBRA-TABS) 100 MG tablet Take 1 tablet (100 mg total) by mouth 2 (two) times daily. 03/31/18   Guadalupe Maple, MD  LATANOPROST OP Apply 1 drop to eye. Right eye each night    [provider]    Inpatient Medications: Scheduled Meds:  Continuous Infusions: . heparin 1,200  Units/hr (07/28/18 1226)   PRN Meds:   Allergies:    Allergies  Allergen Reactions  . Acetazolamide Other (See Comments)    CAUSED RENAL COLIC Other reaction(s): Other (See Comments) CAUSED RENAL COLIC  . Brimonidine Tartrate-Timolol Itching    Redness in eyes  . Other     Pt is a steroid responder Increases eye pressure- hx of glaucoma  . Brinzolamide-Brimonidine Rash    Facial numbness Other reaction(s): Other (See Comments) Facial numbness    Social History:   Social History   Socioeconomic History  . Marital status: Married    Spouse name: Not on file  . Number of children: Not on file  . Years of education: Not on file  . Highest education level: Not on file  Occupational History  . Not on file  Social Needs  . Financial resource strain: Not on file  . Food insecurity:    Worry: Not on file    Inability: Not on file  . Transportation needs:    Medical: Not on file    Non-medical: Not on file  Tobacco Use  . Smoking status: Former Smoker    Packs/day: 1.00    Years: 20.00    Pack years: 20.00    Types: Cigarettes    Last attempt to quit: 07/01/1994    Years since quitting: 24.0  . Smokeless tobacco: Never Used  Substance and Sexual Activity  . Alcohol use: Yes    Alcohol/week: 17.0 standard drinks    Types: 10 Cans of beer, 7 Shots of liquor per week    Comment: daily  . Drug use: No  . Sexual activity: Not on file  Lifestyle  . Physical activity:    Days per week: Not on file    Minutes per session: Not on file  . Stress: Not on file  Relationships  . Social connections:    Talks on phone: Not on file    Gets together: Not on file    Attends religious service: Not on file    Active member of club or organization: Not on file    Attends meetings of clubs or organizations: Not on file    Relationship status: Not on file  . Intimate partner violence:    Fear of current or ex partner: Not on file    Emotionally abused: Not on file     Physically abused: Not on file    Forced sexual activity: Not on file  Other Topics Concern  . Not on file  Social History Narrative  . Not on file    Family History:    Family History  Problem Relation Age of Onset  . Colon cancer Neg Hx   . Esophageal cancer Neg Hx   . Rectal cancer Neg Hx   . Stomach cancer Neg Hx      ROS:  Please see the history of present illness.   All other ROS reviewed and negative.     Physical Exam/Data:   Vitals:   07/28/18 1015 07/28/18 1030 07/28/18 1200 07/28/18 1228  BP: Marland Kitchen)  156/96 (!) 154/97  (!) 142/99  Pulse: 72 74  75  Resp: 19 15  12   Temp:      TempSrc:      SpO2: 96% 98%  98%  Weight:   93 kg   Height:   5\' 11"  (1.803 m)    No intake or output data in the 24 hours ending 07/28/18 1330 Filed Weights   07/28/18 1200  Weight: 93 kg   Body mass index is 28.6 kg/m.   Physical Exam  Constitutional: Appears well-developed and well-nourished. No distress.  HENT:  Head: Normocephalic and atraumatic.  Eyes: Conjunctivae are normal.  Cardiovascular: Normal rate, regular rhythm and normal heart sounds.  Respiratory: Effort normal and breath sounds normal. No respiratory distress. No wheezes.  GI: Soft. Bowel sounds are normal. No distension. There is no tenderness.  Musculoskeletal: No edema.  Neurological: Is alert.  Skin: Not diaphoretic. No erythema.  Psychiatric: Normal mood and affect. Behavior is normal. Judgment and thought content normal.   EKG:  The EKG was personally reviewed and demonstrates: Sinus rhythm, T wave inversions and v1-v3, rbbb  Telemetry:  Telemetry was personally reviewed and demonstrates: Normal sinus rhythm  Relevant CV Studies:  None  Laboratory Data:  Chemistry Recent Labs  Lab 07/28/18 1012  NA 137  K 3.7  CL 105  CO2 24  GLUCOSE 108*  BUN 10  CREATININE 1.00  CALCIUM 9.1  GFRNONAA >60  GFRAA >60  ANIONGAP 8    Recent Labs  Lab 07/28/18 1012  PROT 6.8  ALBUMIN 4.1  AST 29    ALT 38  ALKPHOS 60  BILITOT 1.1   Hematology Recent Labs  Lab 07/28/18 1012  WBC 9.7  RBC 5.26  HGB 16.9  HCT 49.8  MCV 94.7  MCH 32.1  MCHC 33.9  RDW 12.1  PLT 201   Cardiac Enzymes Recent Labs  Lab 07/28/18 1012  TROPONINI 1.22*   No results for input(s): TROPIPOC in the last 168 hours.  BNP Recent Labs  Lab 07/28/18 1012  BNP 47.5    DDimer No results for input(s): DDIMER in the last 168 hours.  Radiology/Studies:  Dg Chest Portable 1 View  Result Date: 07/28/2018 CLINICAL DATA:  Chest pain EXAM: PORTABLE CHEST 1 VIEW COMPARISON:  None. FINDINGS: Heart and mediastinal contours are within normal limits. No focal opacities or effusions. No acute bony abnormality. IMPRESSION: No active disease. Electronically Signed   By: Rolm Baptise M.D.   On: 07/28/2018 10:48    Assessment and Plan:   Unstable angina Patient presented with 2 episodes of chest pain with accompanied dyspnea and numbness in his upper extremities. Most recent chest pain was 3 days ago that occurred with exertion. EKG with T wave inversions in V1 to V3 along with troponin elevation to 1.22.   Patient high risk for ischemic event. Plan for cath today 07/28/18.   -Trend troponin to peak -Continue heparin drip -EKG in a.m. 07/29/2018 -Echo ordered -Fasting lipid panel in am  -A1C ordered  -Nitorglycerin 0.4mg  q54min prn  -Aspirin   Hypertension Patient's blood pressure has been ranging 140-180/90-100s with rates ranging 70 to 80s.  -will start metoprolol after cath       Signed, Lars Mage, MD  07/28/2018 1:30 PM   I have seen, examined and evaluated the patient this PM along with Dr. Myrene Buddy MDP.  After reviewing all the available data and chart, we discussed the patients laboratory, study & physical findings as well as  symptoms in detail. I agree with her findings, examination as well as impression recommendations as per our discussion.    Steve Howell is a very pleasant  62 year old gentleman with history of OSA on CPAP as well as probably hypertension but not who was seen in the emergency room for evaluation of chest pain Vanita Panda.  He has had now 2 prolonged episodes of chest pain one being roughly a week and half ago the past Saturday.  He felt pretty bad towards the day of Saturday and Sunday post symptoms.  He has been working in a remote property doing Psychologist, counselling. just walked up a hill rise and had significant chest pain diaphoresis and dyspnea lasting about half an hour's most recent occurrence.  She essentially self medicated with tequila and went to bed at night.  Felt a bit weak on Sunday and then when he got back to close to urethra he felt concerned enough about his symptoms and came into the emergency room.  Upon evaluation here, his EKG is really not but his troponin is 1.12.  I suspect that he has had an out of hospital MI and if his troponin levels are on the way down.  He is not currently having chest pain, however I think is not unreasonable to proceed with cardiac catheterization to exclude multivessel CAD or unstable plaque that is still not occluded.  If he does have an occluded vessel, by the OAT trial, recommendation would be to simply treat medically, however if the vessel is not PCI would probably.  We will start beta-blocker post-cath.  Also start statin aspirin Plavix for elevated troponin.  He will need to be on his home CPAP and glaucoma medications...   Performing MD: Lauree Chandler, MD or Peter Martinique, M.D., M.S.  Procedure:  LEFT HEART CATHETERIZATION WITH NEGATIVE CORONARY ANGIOGRAPHY AND POSSIBLE PERCUTANEOUS CORONARY INTERVENTION  The procedure with Risks/Benefits/Alternatives and Indications was reviewed with the patient.  All questions were answered.    Risks / Complications include, but not limited to: Death, MI, CVA/TIA, VF/VT (with defibrillation), Bradycardia (need for temporary pacer placement), contrast induced  nephropathy, bleeding / bruising / hematoma / pseudoaneurysm, vascular or coronary injury (with possible emergent CT or Vascular Surgery), adverse medication reactions, infection.  Additional risks involving the use of radiation with the possibility of radiation burns and cancer were explained in detail.  The patient voices understanding and agree to proceed.       Glenetta Hew, M.D., M.S. Interventional Cardiologist   Pager # [phone] Phone # [phone] [address] [place], [place] [address]   For questions or updates, please contact [place] Please consult [email or web address] for contact info under

## 2018-07-29 ENCOUNTER — Inpatient Hospital Stay (HOSPITAL_COMMUNITY): Payer: PPO

## 2018-07-29 ENCOUNTER — Telehealth: Payer: Self-pay | Admitting: Physician Assistant

## 2018-07-29 ENCOUNTER — Encounter (HOSPITAL_COMMUNITY): Payer: Self-pay | Admitting: Cardiovascular Disease

## 2018-07-29 DIAGNOSIS — I361 Nonrheumatic tricuspid (valve) insufficiency: Secondary | ICD-10-CM

## 2018-07-29 DIAGNOSIS — Z955 Presence of coronary angioplasty implant and graft: Secondary | ICD-10-CM

## 2018-07-29 DIAGNOSIS — I25700 Atherosclerosis of coronary artery bypass graft(s), unspecified, with unstable angina pectoris: Secondary | ICD-10-CM

## 2018-07-29 DIAGNOSIS — E785 Hyperlipidemia, unspecified: Secondary | ICD-10-CM

## 2018-07-29 LAB — BASIC METABOLIC PANEL
ANION GAP: 8 (ref 5–15)
BUN: 10 mg/dL (ref 8–23)
CO2: 22 mmol/L (ref 22–32)
Calcium: 8.8 mg/dL — ABNORMAL LOW (ref 8.9–10.3)
Chloride: 107 mmol/L (ref 98–111)
Creatinine, Ser: 0.98 mg/dL (ref 0.61–1.24)
Glucose, Bld: 120 mg/dL — ABNORMAL HIGH (ref 70–99)
POTASSIUM: 3.5 mmol/L (ref 3.5–5.1)
SODIUM: 137 mmol/L (ref 135–145)

## 2018-07-29 LAB — LIPID PANEL
Cholesterol: 156 mg/dL (ref 0–200)
HDL: 27 mg/dL — ABNORMAL LOW (ref >40)
LDL CALC: 83 mg/dL (ref 0–99)
Total CHOL/HDL Ratio: 5.8 RATIO
Triglycerides: 232 mg/dL — ABNORMAL HIGH (ref <150)
VLDL: 46 mg/dL — ABNORMAL HIGH (ref 0–40)

## 2018-07-29 LAB — CBC
HEMATOCRIT: 45.3 % (ref 39.0–52.0)
Hemoglobin: 15.3 g/dL (ref 13.0–17.0)
MCH: 31.3 pg (ref 26.0–34.0)
MCHC: 33.8 g/dL (ref 30.0–36.0)
MCV: 92.6 fL (ref 80.0–100.0)
Platelets: 211 10*3/uL (ref 150–400)
RBC: 4.89 MIL/uL (ref 4.22–5.81)
RDW: 12 % (ref 11.5–15.5)
WBC: 10.7 10*3/uL — ABNORMAL HIGH (ref 4.0–10.5)
nRBC: 0 % (ref 0.0–0.2)

## 2018-07-29 LAB — ECHOCARDIOGRAM COMPLETE
HEIGHTINCHES: 71 in
Weight: 3308.66 oz

## 2018-07-29 LAB — HEMOGLOBIN A1C
Hgb A1c MFr Bld: 5.1 % (ref 4.8–5.6)
MEAN PLASMA GLUCOSE: 99.67 mg/dL

## 2018-07-29 LAB — HIV ANTIBODY (ROUTINE TESTING W REFLEX): HIV Screen 4th Generation wRfx: NONREACTIVE

## 2018-07-29 LAB — PROTIME-INR
INR: 1.11
PROTHROMBIN TIME: 14.2 s (ref 11.4–15.2)

## 2018-07-29 LAB — TROPONIN I: Troponin I: 1.45 ng/mL (ref <0.03)

## 2018-07-29 MED ORDER — TICAGRELOR 90 MG PO TABS: 90.0000 mg | ORAL_TABLET | Freq: Two times a day (BID) | ORAL | 11 refills | Status: DC

## 2018-07-29 MED ORDER — ASPIRIN 81 MG PO TBEC: 81.0000 mg | DELAYED_RELEASE_TABLET | Freq: Every day | ORAL | Status: DC

## 2018-07-29 MED ORDER — CARVEDILOL 3.125 MG PO TABS
3.1250 mg | ORAL_TABLET | Freq: Two times a day (BID) | ORAL | Status: DC
  Administered 2018-07-29: 3.125 mg via ORAL
  Filled 2018-07-29: qty 1

## 2018-07-29 MED ORDER — ATORVASTATIN CALCIUM 80 MG PO TABS: 80.0000 mg | ORAL_TABLET | Freq: Every day | ORAL | 11 refills | Status: DC

## 2018-07-29 MED ORDER — POTASSIUM CHLORIDE CRYS ER 20 MEQ PO TBCR
40.0000 meq | EXTENDED_RELEASE_TABLET | Freq: Two times a day (BID) | ORAL | Status: DC
  Administered 2018-07-29: 09:00:00 40 meq via ORAL
  Filled 2018-07-29: qty 2

## 2018-07-29 MED ORDER — CARVEDILOL 3.125 MG PO TABS: 3.1250 mg | ORAL_TABLET | Freq: Two times a day (BID) | ORAL | 11 refills | Status: DC

## 2018-07-29 MED ORDER — NITROGLYCERIN 0.4 MG SL SUBL: 0.4000 mg | SUBLINGUAL_TABLET | SUBLINGUAL | 12 refills | Status: DC | PRN

## 2018-07-29 MED FILL — Lidocaine HCl Local Preservative Free (PF) Inj 1%: INTRAMUSCULAR | Qty: 30 | Status: AC

## 2018-07-29 MED FILL — BRILINTA 90 MG TABLET: 90 | 60 days supply | Qty: 60 | Fill #0

## 2018-07-29 NOTE — Care Management (Signed)
#   1.   S/W  LOUISA  @ [facility]  #  [phone]  OPT-2             BRILINTA  90 MG BID COVER- YES CO-PAY- $ 40.00 TIER- 3 DRUG PRIOR APPROVAL- NO  PATIENT IN THE INITIAL STAGE  PREFERRED  PHARMACY :   YES [facility] 90 DAY SUPPLY FOR  M/O  OR RETAIL $ 80.00

## 2018-07-29 NOTE — Discharge Summary (Signed)
Discharge Summary    Patient ID: Steve Howell MRN: [id number]; DOB: 03-23-1956  Admit date: 07/28/2018 Discharge date: 07/29/2018  Primary Care Provider: Guadalupe Maple, MD  Primary Cardiologist: New to [facility] (likes to follow up at [facility])  Discharge Diagnoses    Active Problems:   Unstable angina ([place])   Non-ST elevation (NSTEMI) myocardial infarction ([place])  HTN  HLD  Allergies Allergies  Allergen Reactions  . Acetazolamide Other (See Comments)    CAUSED RENAL COLIC Other reaction(s): Other (See Comments) CAUSED RENAL COLIC  . Brimonidine Tartrate-Timolol Itching    Redness in eyes  . Other     Pt is a steroid responder Increases eye pressure- hx of glaucoma  . Brinzolamide-Brimonidine Rash    Facial numbness Other reaction(s): Other (See Comments) Facial numbness    Diagnostic Studies/Procedures    CORONARY STENT INTERVENTION  LEFT HEART CATH AND CORONARY ANGIOGRAPHY  Conclusion     Dist RCA lesion is 95% stenosed.  Prox LAD to Mid LAD lesion is 25% stenosed.  Ost 2nd Mrg to 2nd Mrg lesion is 20% stenosed.  A drug-eluting stent was successfully placed using a STENT SYNERGY DES 2.5X20.  Post intervention, there is a 0% residual stenosis.  The left ventricular systolic function is normal.  LV end diastolic pressure is normal.  There is no mitral valve regurgitation.  The left ventricular ejection fraction is 55-65% by visual estimate.  1. Severe single vessel CAD. Severe stenosis distal RCA.  2. Successful PTCA/DES x 1 distal RCA 3. Mild non-obstructive disease in the LAD and Circumflex 4. Normal LV systolic function  Recommendations: Recommend uninterrupted dual antiplatelet therapy with Aspirin 81mg  daily and Ticagrelor 90 mg po twice daily for a minimum of 12 months (ACS - Class I recommendation). Continue statin and beta blocker. Likely d/c home tomorrow.     Diagnostic Diagram       Post-Intervention Diagram         Echo 07/29/18 Study Conclusions  - Left ventricle: The cavity size was normal. Wall thickness was   increased in a pattern of mild LVH. Systolic function was normal.   The estimated ejection fraction was in the range of 60% to 65%.   Left ventricular diastolic function parameters were normal. - Mitral valve: Mildly to moderately calcified annulus. Moderately   thickened, mildly calcified leaflets . - Atrial septum: No defect or patent foramen ovale was identified.    History of Present Illness     62 y.o. male with a hx of hypertensionnot on any antihypertensive medications, nephrolithiasis,OSA on CPAPwithoutany prior cardiac historypresented for chest pain.   Patient presented with 2 episodes of chest pain with accompanied dyspnea and numbness in his upper extremities.Most recent chest pain was 3 days ago that occurred with exertion.EKG with T wave inversions in V1 to V3 along with troponin elevation to1.22  Hospital Course     Consultants: None  1. NSTEMI - Admitted and taken to cath lab directly which showed severe single vessel CAD with severe stenosis distal RCA s/p successful PTCA/DES x 1. Mild non-obstructive disease in the LAD and Circumflex. Normal LV function. Echo showed normal LVEF of 60-65%. Peak of troponin 1.45. Ambulated without recurrent angina. Continue ASA, Brillinta, statin and BB.   2. HTN - not on any medication prior to presentation. Started coreg 3.170mh BID. Up titrate as outpatient. Advised to keep log.   3. HLD - 07/29/2018: Cholesterol 156; HDL 27; LDL Cholesterol 83; Triglycerides 232; VLDL 46  - LDL  goal less than 70. Consider OP f/u labs 6-8 weeks given statin initiation this admission.   Discharge Vitals Blood pressure (!) 153/87, pulse 79, temperature 98.6 F (37 C), temperature source Oral, resp. rate (!) 21, height 5\' 11"  (1.803 m), weight 93.8 kg, SpO2 97 %.  Filed Weights   07/28/18 1200 07/29/18 0631  Weight: 93 kg 93.8 kg     Labs & Radiologic Studies    CBC Recent Labs    07/28/18 1012 07/29/18 0357  WBC 9.7 10.7*  NEUTROABS 6.8  --   HGB 16.9 15.3  HCT 49.8 45.3  MCV 94.7 92.6  PLT 201 681   Basic Metabolic Panel Recent Labs    07/28/18 1012 07/29/18 0357  NA 137 137  K 3.7 3.5  CL 105 107  CO2 24 22  GLUCOSE 108* 120*  BUN 10 10  CREATININE 1.00 0.98  CALCIUM 9.1 8.8*  MG 2.2  --    Liver Function Tests Recent Labs    07/28/18 1012  AST 29  ALT 38  ALKPHOS 60  BILITOT 1.1  PROT 6.8  ALBUMIN 4.1   Cardiac Enzymes Recent Labs    07/28/18 1012 07/29/18 1004  TROPONINI 1.22* 1.45*   Hemoglobin A1C Recent Labs    07/29/18 0357  HGBA1C 5.1   Fasting Lipid Panel Recent Labs    07/29/18 0357  CHOL 156  HDL 27*  LDLCALC 83  TRIG 232*  CHOLHDL 5.8   _____________  Dg Chest Portable 1 View  Result Date: 07/28/2018 CLINICAL DATA:  Chest pain EXAM: PORTABLE CHEST 1 VIEW COMPARISON:  None. FINDINGS: Heart and mediastinal contours are within normal limits. No focal opacities or effusions. No acute bony abnormality. IMPRESSION: No active disease. Electronically Signed   By: Rolm Baptise M.D.   On: 07/28/2018 10:48   Disposition   Pt is being discharged home today in good condition.  Follow-up Plans & Appointments    Follow-up Information    Rise Mu, PA-C. Go on 08/05/2018.   Specialties:  Physician Assistant, Cardiology, Radiology Why:  @2pm  for hospital follow up. please arrive 15 minutes early  Contact information: [place] [address] [phone]          Discharge Instructions    Amb Referral to Cardiac Rehabilitation   Complete by:  As directed    Diagnosis:   NSTEMI Coronary Stents     Diet - low sodium heart healthy   Complete by:  As directed    Discharge instructions   Complete by:  As directed    NO HEAVY LIFTING (>10lbs) X 2 WEEKS. NO SEXUAL ACTIVITY X 2 WEEKS. NO DRIVING X 1 WEEK. NO SOAKING  BATHS, HOT TUBS, POOLS, ETC., X 7 DAYS.   Increase activity slowly   Complete by:  As directed       Discharge Medications   Allergies as of 07/29/2018      Reactions   Acetazolamide Other (See Comments)   CAUSED RENAL COLIC Other reaction(s): Other (See Comments) CAUSED RENAL COLIC   Brimonidine Tartrate-timolol Itching   Redness in eyes   Other    Pt is a steroid responder Increases eye pressure- hx of glaucoma   Brinzolamide-brimonidine Rash   Facial numbness Other reaction(s): Other (See Comments) Facial numbness      Medication List    STOP taking these medications   doxycycline 100 MG tablet Commonly known as:  VIBRA-TABS     TAKE these medications  aspirin 81 MG EC tablet Take 1 tablet (81 mg total) by mouth daily. Start taking on:  07/30/2018   atorvastatin 80 MG tablet Commonly known as:  LIPITOR Take 1 tablet (80 mg total) by mouth daily at 6 PM.   brimonidine-timolol 0.2-0.5 % ophthalmic solution Commonly known as:  COMBIGAN Place 1 drop into the right eye every 12 (twelve) hours.   carvedilol 3.125 MG tablet Commonly known as:  COREG Take 1 tablet (3.125 mg total) by mouth 2 (two) times daily with a meal.   LATANOPROST OP Apply 1 drop to eye. Right eye each night   nitroGLYCERIN 0.4 MG SL tablet Commonly known as:  NITROSTAT Place 1 tablet (0.4 mg total) under the tongue every 5 (five) minutes x 3 doses as needed for chest pain.   ticagrelor 90 MG Tabs tablet Commonly known as:  BRILINTA Take 1 tablet (90 mg total) by mouth 2 (two) times daily.        Acute coronary syndrome (MI, NSTEMI, STEMI, etc) this admission?: Yes.     AHA/ACC Clinical Performance & Quality Measures: 1. Aspirin prescribed? - Yes 2. ADP Receptor Inhibitor (Plavix/Clopidogrel, Brilinta/Ticagrelor or Effient/Prasugrel) prescribed (includes medically managed patients)? - Yes 3. Beta Blocker prescribed? - Yes 4. High Intensity Statin (Lipitor 40-80mg  or Crestor  20-40mg ) prescribed? - Yes 5. EF assessed during THIS hospitalization? - Yes 6. For EF <40%, was ACEI/ARB prescribed? - Not Applicable (EF >/= 32%) 7. For EF <40%, Aldosterone Antagonist (Spironolactone or Eplerenone) prescribed? - Not Applicable (EF >/= 67%) 8. Cardiac Rehab Phase II ordered (Included Medically managed Patients)? - Yes     Outstanding Labs/Studies   Consider OP f/u labs 6-8 weeks given statin initiation this admission.  Duration of Discharge Encounter   Greater than 30 minutes including physician time.  Jarrett Soho, PA 07/29/2018, 12:58 PM

## 2018-07-29 NOTE — Progress Notes (Signed)
Progress Note  Patient Name: Steve Howell Date of Encounter: 07/29/2018  Primary Cardiologist: New to Dr. Ellyn Hack   Subjective   Mild chest discomfort early morning. Now resolved. Nothing like presenting symptoms. Will ambulate.   Inpatient Medications    Scheduled Meds: . aspirin EC  81 mg Oral Daily  . atorvastatin  80 mg Oral q1800  . feeding supplement (ENSURE ENLIVE)  237 mL Oral BID BM  . latanoprost  1 drop Right Eye QHS  . sodium chloride flush  3 mL Intravenous Q12H  . ticagrelor  90 mg Oral BID   Continuous Infusions: . sodium chloride    . nitroGLYCERIN Stopped (07/28/18 2000)   PRN Meds: sodium chloride, acetaminophen, nitroGLYCERIN, ondansetron (ZOFRAN) IV, sodium chloride flush   Vital Signs    Vitals:   07/28/18 2200 07/28/18 2300 07/29/18 0000 07/29/18 0631  BP: (!) 160/84 (!) 149/88 (!) 144/77 (!) 147/86  Pulse: 90 86 77 76  Resp: 17 17 15 13   Temp:    98.8 F (37.1 C)  TempSrc:    Oral  SpO2: 97% 96% 95% 99%  Weight:    93.8 kg  Height:        Intake/Output Summary (Last 24 hours) at 07/29/2018 0743 Last data filed at 07/29/2018 5366 Gross per 24 hour  Intake 780 ml  Output 600 ml  Net 180 ml   Filed Weights   07/28/18 1200 07/29/18 0631  Weight: 93 kg 93.8 kg    Telemetry    SR at rate of 70/80s - Personally Reviewed  ECG    NSR- Personally Reviewed  Physical Exam   GEN: No acute distress.   Neck: No JVD Cardiac: RRR, no murmurs, rubs, or gallops. Right radial cath site without hematoma.  Respiratory: Clear to auscultation bilaterally. GI: Soft, nontender, non-distended  MS: No edema; No deformity. Neuro:  Nonfocal  Psych: Normal affect   Labs    Chemistry Recent Labs  Lab 07/28/18 1012 07/29/18 0357  NA 137 137  K 3.7 3.5  CL 105 107  CO2 24 22  GLUCOSE 108* 120*  BUN 10 10  CREATININE 1.00 0.98  CALCIUM 9.1 8.8*  PROT 6.8  --   ALBUMIN 4.1  --   AST 29  --   ALT 38  --   ALKPHOS 60  --   BILITOT  1.1  --   GFRNONAA >60 >60  GFRAA >60 >60  ANIONGAP 8 8     Hematology Recent Labs  Lab 07/28/18 1012 07/29/18 0357  WBC 9.7 10.7*  RBC 5.26 4.89  HGB 16.9 15.3  HCT 49.8 45.3  MCV 94.7 92.6  MCH 32.1 31.3  MCHC 33.9 33.8  RDW 12.1 12.0  PLT 201 211    Cardiac Enzymes Recent Labs  Lab 07/28/18 1012  TROPONINI 1.22*   No results for input(s): TROPIPOC in the last 168 hours.   BNP Recent Labs  Lab 07/28/18 1012  BNP 47.5     DDimer No results for input(s): DDIMER in the last 168 hours.   Radiology    Dg Chest Portable 1 View  Result Date: 07/28/2018 CLINICAL DATA:  Chest pain EXAM: PORTABLE CHEST 1 VIEW COMPARISON:  None. FINDINGS: Heart and mediastinal contours are within normal limits. No focal opacities or effusions. No acute bony abnormality. IMPRESSION: No active disease. Electronically Signed   By: Rolm Baptise M.D.   On: 07/28/2018 10:48    Cardiac Studies   CORONARY STENT INTERVENTION  LEFT HEART CATH AND CORONARY ANGIOGRAPHY  Conclusion     Dist RCA lesion is 95% stenosed.  Prox LAD to Mid LAD lesion is 25% stenosed.  Ost 2nd Mrg to 2nd Mrg lesion is 20% stenosed.  A drug-eluting stent was successfully placed using a STENT SYNERGY DES 2.5X20.  Post intervention, there is a 0% residual stenosis.  The left ventricular systolic function is normal.  LV end diastolic pressure is normal.  There is no mitral valve regurgitation.  The left ventricular ejection fraction is 55-65% by visual estimate.   1. Severe single vessel CAD. Severe stenosis distal RCA.  2. Successful PTCA/DES x 1 distal RCA 3. Mild non-obstructive disease in the LAD and Circumflex 4. Normal LV systolic function  Recommendations: Recommend uninterrupted dual antiplatelet therapy with Aspirin 81mg  daily and Ticagrelor 90 mg po twice daily for a minimum of 12 months (ACS - Class I recommendation). Continue statin and beta blocker. Likely d/c home tomorrow.      Diagnostic Diagram       Post-Intervention Diagram          Patient Profile     62 y.o. male with a hx of hypertension not on any antihypertensive medications, nephrolithiasis, OSA on CPAP without any prior cardiac history presented for chest pain.   Patient presented with 2 episodes of chest pain with accompanied dyspnea and numbness in his upper extremities. Most recent chest pain was 3 days ago that occurred with exertion. EKG with T wave inversions in V1 to V3 along with troponin elevation to 1.22.   Assessment & Plan    1. NSTEMI - admitted and taken to cath lab which showed severe single vessel CAD with severe stenosis distal RCA s/p successful PTCA/DES x 1. Mild non-obstructive disease in the LAD and Circumflex. Normal LV function. Pending echo. Continue ASA, Brillinta, statin and will add BB.   2. HTN - not on any medication prior to presentation. Start coreg 3.176mh BID.   3. HLD - 07/29/2018: Cholesterol 156; HDL 27; LDL Cholesterol 83; Triglycerides 232; VLDL 46  - LDL goal less than 70. Consider OP f/u labs 6-8 weeks given statin initiation this admission.    For questions or updates, please contact [place] Please consult [email or web address] for contact info under        SignedLeanor Kail, PA  07/29/2018, 7:43 AM

## 2018-07-29 NOTE — Progress Notes (Signed)
Patient stated he was going to drive home. Patient educated on the importance of not driving per MD order and discharge instructions. Patient states his son is picking him up and he will not drive. Bhagat PA informed and states that "patient can not drive for 48 hours." Patient informed and states he will not drive.

## 2018-07-29 NOTE — Telephone Encounter (Signed)
Patient currently admitted

## 2018-07-29 NOTE — Care Management Note (Signed)
Case Management Note  Patient Details  Name: CHIMAOBI CASEBOLT MRN: [id number] Date of Birth: Feb 27, 1956  Subjective/Objective:   From home, pta indep, s/p stent intervention, NCM spoke with patient, he has his 1 day free brilinta which was filled by the transition of care pharmacy.  NCM informed him that refills will be an estimate of 40.00 at local pharmacy.                   Action/Plan: DC when ready.  Expected Discharge Date:                  Expected Discharge Plan:  Home/Self Care  In-House Referral:     Discharge planning Services  CM Consult, Medication Assistance  Post Acute Care Choice:    Choice offered to:     DME Arranged:    DME Agency:     HH Arranged:    HH Agency:     Status of Service:  Completed, signed off  If discussed at [employer] of Stay Meetings, dates discussed:    Additional Comments:  Zenon Mayo, RN 07/29/2018, 12:34 PM

## 2018-07-29 NOTE — Progress Notes (Signed)
Per Neoma Laming patient is not a code 17 and can be discharged home.

## 2018-07-29 NOTE — Progress Notes (Signed)
  Echocardiogram 2D Echocardiogram has been performed.  Steve Howell 07/29/2018, 11:51 AM

## 2018-07-29 NOTE — Progress Notes (Addendum)
Patient states he does not have blue cross blue shield insurance, however he has health team advantage. Copy of card in chart. Member number is [id number] Case Manager Neoma Laming informed.  Patient states he has [employer] as well, copy of that card placed on chart and Neoma Laming notified.

## 2018-07-29 NOTE — Progress Notes (Signed)
Nutrition Brief Note  Patient identified on the Malnutrition Screening Tool (MST) Report.  Wt Readings from Last 15 Encounters:  07/29/18 93.8 kg  03/31/18 93 kg  11/26/16 91.6 kg  11/04/16 94.8 kg  10/02/16 96 kg  09/30/16 96.2 kg  09/16/16 96.4 kg  06/26/15 93.9 kg  06/29/14 94.3 kg  07/29/14 95 kg  07/08/14 93.2 kg  04/13/14 88.5 kg  08/12/11 90.7 kg  07/29/11 90.7 kg    Body mass index is 28.84 kg/m. Patient meets criteria for overweight based on current BMI.   Current diet order is Heart Healthy, patient is consuming approximately 100% of meals at this time. Labs and medications reviewed.   No nutrition interventions warranted at this time. Will d/c order for Ensure Enlive. If nutrition issues arise, please consult RD.    Gaynell Face, MS, RD, LDN Inpatient Clinical Dietitian Pager: [phone] Weekend/After Hours: [phone]

## 2018-07-29 NOTE — Progress Notes (Addendum)
CARDIAC REHAB PHASE I   PRE:  Rate/Rhythm: 84 SR  BP:  Sitting: 178/97        SaO2: 99 RA  MODE:  Ambulation: 500 ft   POST:  Rate/Rhythm: 91 SR  BP:  Sitting: 177/86        SaO2: 100 RA  0810 - 0930  Pt ambulated independently for 500 feet. Tolerated well. Gait steady. Pt given MI booklet and reviewed stent card. Pt educated on NTG usage, restrictions, diet (HH), exercise guidelines. Pt referred to CRP[facility] at [facility]. Pt voiced understanding.  Philis Kendall, MS 07/29/2018 9:24 AM

## 2018-07-29 NOTE — H&P (Signed)
See yesterday's consult note.   Leanor Kail, PAC

## 2018-07-29 NOTE — Progress Notes (Signed)
RT set up patient home unit CPAP at beside. Patient is able to place hisself on when he is ready.

## 2018-07-29 NOTE — Telephone Encounter (Signed)
TCM appointment scheduled for 08/05/18 with Steve Howell Seen at [facility], but lives in [place] per [place]

## 2018-07-30 NOTE — Telephone Encounter (Signed)
1st TCM attempt : No answer. Left message to call back.

## 2018-07-31 NOTE — Telephone Encounter (Signed)
2nd TCM attempt: I left a message for the patient to call back.

## 2018-08-03 NOTE — Telephone Encounter (Signed)
Patient contacted regarding discharge from [facility] on 07/29/18.   Patient understands to follow up with provider ? On 08/05/18 at 2 pm at [facility].  Patient understands discharge instructions? Yes  Patient understands medications and regiment? Yes Patient understands to bring all medications to this visit? Yes

## 2018-08-04 NOTE — Progress Notes (Signed)
Cardiology Office Note Date:  08/05/2018  Patient ID:  Steve Howell, Steve Howell [birth year], MRN [id number] PCP:  Guadalupe Maple, MD  Cardiologist:  Dr. Mathews Argyle, MD (to establish in [place])    Chief Complaint: Hospital follow up  History of Present Illness: BUBBER ROTHERT is a 62 y.o. male with history of recently diagnosed CAD in the setting of a non-STEMI on 07/28/2018 as detailed below, HTN, nephrolithiasis, OSA on CPAP who presents for hospital follow-up after recent admission to [facility] from 11/19 through 11/20 for non-STEMI.  Patient presented to the hospital on 11/19 without any previously known cardiac history.  He reported an episode of chest pain that occurred 3 days prior that was described as severe in nature and lasted for approximately 30 minutes to 1 hour with associated dyspnea and numbness and tingling in his upper extremities.  EKG showed inverted T waves and the anterior leads with a right bundle branch block.  He was noted to have a troponin elevation that peaked at 1.45.  BNP was 47.  Chest x-ray did not show any acute cardiopulmonary abnormalities.  Patient underwent cardiac catheterization on 07/28/2018 that showed severe single vessel CAD with severe stenosis of the distal RCA.  He underwent successful PCI/DES x1 to the distal RCA.  He was otherwise noted to have mild nonobstructive disease in the LAD and LCx.  He had normal LV systolic function.  Echo on 07/29/2018 showed mild LVH with an EF of 60 to 96%, normal diastolic function, mildly to moderately calcified mitral annulus, no ASD or PFO was identified.  Labs: A1c 5.1, potassium 3.5, serum creatinine 0.98, triglycerides 232, LDL 83, WBC 10.7, hemoglobin 15.3, magnesium 2.2, LFT normal  Discharge cardiac medications included aspirin 81 mg, atorvastatin 80 mg, Coreg 3.125 mg twice daily, sublingual nitro glycerin as needed, and Brilinta 90 mg twice daily.  Discharge weight 93.8 kg.  Patient comes in doing  well from a cardiac perspective since his discharge.  He has not any symptoms concerning for further angina.  He has been compliant with aspirin and Brilinta without missing any doses.  Of note, the patient does indicate the pharmacy printed his specific on the Brilinta prescription as 90 mg once daily.  He called the pharmacy to question this and they noted at their error as it should be twice daily.  He has not had any falls, BRBPR, or melena.  He is tolerating carvedilol and Lipitor without issues.  He continues to note blood pressures consistently in the 789F systolic.  He previously self medicated for insomnia with drinking 2 shots of tequila on a nightly basis and has not done so since his MI.  He is under increased stress at home as he has been building a house in [place] for the past 2.5 years and now will be faced with having to pay for 2 houses.  He requests transition from Brilinta to clopidogrel moving forward secondary to finances.  His co-pay for Brilinta would be approximately $45 per month.  He does have the free 30-day supply.  Ultimately, he will be transitioning to care in [place] once he relocates though he is not certain when this will be.  No issues from a cardiac cath site.  Patient has noted some right sided neck, right jaw, right ear paresthesias and tinnitus since his cardiac cath.  There have also been some intermittent right-sided headaches.  He is concerned for possible piece of unstable plaque rupture leading to the symptoms.  He  is currently without symptoms.   Past Medical History:  Diagnosis Date  . Arthritis    "hands, was in my knees" (07/28/2018)  . Chronic kidney disease   . Coronary artery disease    a. cath severe dRCA stenosis s/p PTCA/DES x 1 07/28/18  . GERD (gastroesophageal reflux disease)   . Glaucoma, right eye   . History of kidney stones   . Hypertension    hx of, no medications needed now  . OSA on CPAP   . Pneumonia    "couple times; nothing in  more than 10 yrs" (07/28/2018)  . Wears glasses     Past Surgical History:  Procedure Laterality Date  . BACK SURGERY    . CATARACT EXTRACTION W/ INTRAOCULAR LENS IMPLANT Right 2015  . CATARACT EXTRACTION W/ INTRAOCULAR LENS IMPLANT Right   . COLONOSCOPY    . CORONARY ANGIOPLASTY WITH STENT PLACEMENT  07/28/2018  . CORONARY STENT INTERVENTION N/A 07/28/2018   Procedure: CORONARY STENT INTERVENTION;  Surgeon: Burnell Blanks, MD;  Location: [facility];  Service: Cardiovascular;  Laterality: N/A;  . CYSTOSCOPY WITH RETROGRADE PYELOGRAM, URETEROSCOPY AND STENT PLACEMENT Bilateral 07/08/2014   Procedure: CYSTOSCOPY WITH BILATERAL RETROGRADE PYELOGRAM, right STENT PLACEMENT;  Surgeon: Festus Aloe, MD;  Location[facility];  Service: Urology;  Laterality: Bilateral;  . CYSTOSCOPY WITH URETEROSCOPY AND STENT PLACEMENT Right 07/29/2014   Procedure: CYSTO  WITH RIGHT URETEROSCOPY/LITHOTRIPSY/STENT rePLACEMENT;  Surgeon: Festus Aloe, MD;  Location: [facility];  Service: Urology;  Laterality: Right;  . EYE SURGERY    . GLAUCOMA SURGERY Right 09/2015  . HOLMIUM LASER APPLICATION Right 29/51/8841   Procedure: HOLMIUM LASER APPLICATION;  Surgeon: Festus Aloe, MD;  Location: [facility];  Service: Urology;  Laterality: Right;  . LEFT HEART CATH AND CORONARY ANGIOGRAPHY N/A 07/28/2018   Procedure: LEFT HEART CATH AND CORONARY ANGIOGRAPHY;  Surgeon: Burnell Blanks, MD;  Location: [facility];  Service: Cardiovascular;  Laterality: N/A;  . LUMBAR DISC SURGERY  2000   L4-5  . PHOTOCOAGULATION WITH LASER Right 11/04/2016   Procedure: transcleral diode cyclophotocoagulation;  Surgeon: Ronnell Freshwater, MD;  Location: [facility];  Service: Ophthalmology;  Laterality: Right;  . REFRACTIVE SURGERY Right    "multiple times"  . RETINAL DETACHMENT SURGERY Right 04/04/2014   2015  . TONSILLECTOMY       Current Meds  Medication Sig  . aspirin EC 81 MG EC tablet Take 1 tablet (81 mg total) by mouth daily.  Marland Kitchen atorvastatin (LIPITOR) 80 MG tablet Take 1 tablet (80 mg total) by mouth daily at 6 PM.  . brimonidine-timolol (COMBIGAN) 0.2-0.5 % ophthalmic solution Place 1 drop into the right eye every 12 (twelve) hours.  Marland Kitchen LATANOPROST OP Apply 1 drop to eye. Right eye each night  . nitroGLYCERIN (NITROSTAT) 0.4 MG SL tablet Place 1 tablet (0.4 mg total) under the tongue every 5 (five) minutes x 3 doses as needed for chest pain.  . ticagrelor (BRILINTA) 90 MG TABS tablet Take 1 tablet (90 mg total) by mouth 2 (two) times daily.  . [DISCONTINUED] carvedilol (COREG) 3.125 MG tablet Take 1 tablet (3.125 mg total) by mouth 2 (two) times daily with a meal.    Allergies:   Acetazolamide; Brimonidine tartrate-timolol; Other; and Brinzolamide-brimonidine   Social History:  The patient  reports that he quit smoking about 24 years ago. His smoking use included cigarettes. He has a 20.00 pack-year smoking history. He has  never used smokeless tobacco. He reports that he drinks about 17.0 standard drinks of alcohol per week. He reports that he does not use drugs.   Family History:  The patient's family history is not on file.  ROS:   Review of Systems  Constitutional: Negative for chills, diaphoresis, fever, malaise/fatigue and weight loss.  HENT: Negative for congestion.   Eyes: Negative for discharge and redness.  Respiratory: Negative for cough, hemoptysis, sputum production, shortness of breath and wheezing.   Cardiovascular: Negative for chest pain, palpitations, orthopnea, claudication, leg swelling and PND.  Gastrointestinal: Negative for abdominal pain, blood in stool, heartburn, melena, nausea and vomiting.  Genitourinary: Negative for hematuria.  Musculoskeletal: Negative for falls and myalgias.  Skin: Negative for rash.  Neurological: Negative for dizziness, tingling, tremors, sensory change,  speech change, focal weakness, loss of consciousness and weakness.  Endo/Heme/Allergies: Does not bruise/bleed easily.  Psychiatric/Behavioral: Negative for substance abuse. The patient is not nervous/anxious.   All other systems reviewed and are negative.    PHYSICAL EXAM:  VS:  BP (!) 142/80 (BP Location: Left Arm, Patient Position: Sitting, Cuff Size: Normal)   Pulse 84   Ht 5\' 11"  (1.803 m)   Wt 206 lb (93.4 kg)   BMI 28.73 kg/m  BMI: Body mass index is 28.73 kg/m.  Physical Exam  Constitutional: He is oriented to person, place, and time. He appears well-developed and well-nourished.  HENT:  Head: Normocephalic and atraumatic.  Eyes: Right eye exhibits no discharge. Left eye exhibits no discharge.  Neck: Normal range of motion. No JVD present.  Cardiovascular: Normal rate, regular rhythm, S1 normal, S2 normal and normal heart sounds. Exam reveals no distant heart sounds, no friction rub, no midsystolic click and no opening snap.  No murmur heard. Pulses:      Posterior tibial pulses are 2+ on the right side, and 2+ on the left side.  Right radial cardiac cath site with out bleeding, bruising, swelling, erythema, warmth, tenderness to palpation.  Radial pulse 2+.  Right femoral cardiac cath site without active bleeding, bruising, swelling, erythema, warmth, or tenderness to palpation.  No bruit.  Pulmonary/Chest: Effort normal and breath sounds normal. No respiratory distress. He has no decreased breath sounds. He has no wheezes. He has no rales. He exhibits no tenderness.  Abdominal: Soft. He exhibits no distension. There is no tenderness.  Musculoskeletal: He exhibits no edema.  Neurological: He is alert and oriented to person, place, and time.  Skin: Skin is warm and dry. No cyanosis. Nails show no clubbing.  Psychiatric: He has a normal mood and affect. His speech is normal and behavior is normal. Judgment and thought content normal.     EKG:  Was ordered and interpreted by  me today. Shows NSR, 84 bpm, incomplete RBBB, inferior Q waves with TWI  Recent Labs: 03/31/2018: TSH 1.630 07/28/2018: ALT 38; B Natriuretic Peptide 47.5; Magnesium 2.2 07/29/2018: BUN 10; Creatinine, Ser 0.98; Hemoglobin 15.3; Platelets 211; Potassium 3.5; Sodium 137  07/29/2018: Cholesterol 156; HDL 27; LDL Cholesterol 83; Total CHOL/HDL Ratio 5.8; Triglycerides 232; VLDL 46   Estimated Creatinine Clearance: 90 mL/min (by C-G formula based on SCr of 0.98 mg/dL).   Wt Readings from Last 3 Encounters:  08/05/18 206 lb (93.4 kg)  07/29/18 206 lb 12.7 oz (93.8 kg)  03/31/18 205 lb (93 kg)     Other studies reviewed: Additional studies/records reviewed today include: summarized above  ASSESSMENT AND PLAN:  1. CAD involving the native coronary arteries without  angina: He is doing well from a cardiac perspective without any symptoms concerning for angina.  Continue dual antiplatelet therapy with aspirin 81 mg daily and Brilinta 90 mg twice daily without interruption for at least the next 12 months.  Patient does state he is able to afford Brilinta for several months though ideally would like to transition to generic clopidogrel long-term secondary to finances.  For now, he has a left Brilinta to satisfy 30 days on therapy.  I will have him follow-up with interventional cardiology in 1 month to determine if he should remain on Brilinta or transition to clopidogrel.  I am uncertain if Effient will be cheaper than Brilinta given his insurance policy.  Perhaps the patient could check into this.  Cardiac rehab.  Post-cath instructions discussed.  Aggressive secondary prevention.  2. Hyperlipidemia: LDL of 83 during recent admission to [facility] earlier this month.  Goal LDL less than 70.  Remains on Lipitor 80 mg daily.  In approximately 8 weeks check repeat fasting lipid panel and liver function.  If LDL is not at goal at that time consider adding Zetia 10 mg daily versus transition to Crestor 40 mg  daily.  3. Hypertension: Blood pressure is suboptimally controlled today.  Increase carvedilol to 6.25 mg twice daily.  4. OSA: On CPAP.  5. Right side neck and jaw paresthesia: Discussed with patient further evaluation with CTA of the head and neck versus carotid artery ultrasound.  Patient prefers to proceed with ultrasound initially.  Patient is currently asymptomatic.  Await ultrasound results.  Continue atorvastatin, aspirin, and Brilinta as above.  Disposition: F/u with Dr. Fletcher Anon or End in 4 weeks.  Current medicines are reviewed at length with the patient today.  The patient did not have any concerns regarding medicines.  Signed, Christell Faith, PA-C 08/05/2018 3:06 PM     [address] [place], [place] [address] [phone]

## 2018-08-05 ENCOUNTER — Encounter: Payer: Self-pay | Admitting: Physician Assistant

## 2018-08-05 ENCOUNTER — Ambulatory Visit (INDEPENDENT_AMBULATORY_CARE_PROVIDER_SITE_OTHER): Payer: PPO | Admitting: Physician Assistant

## 2018-08-05 ENCOUNTER — Other Ambulatory Visit: Payer: Self-pay | Admitting: *Deleted

## 2018-08-05 VITALS — BP 142/80 | HR 84 | Ht 71.0 in | Wt 206.0 lb

## 2018-08-05 DIAGNOSIS — I214 Non-ST elevation (NSTEMI) myocardial infarction: Secondary | ICD-10-CM | POA: Diagnosis not present

## 2018-08-05 DIAGNOSIS — G4733 Obstructive sleep apnea (adult) (pediatric): Secondary | ICD-10-CM

## 2018-08-05 DIAGNOSIS — R202 Paresthesia of skin: Secondary | ICD-10-CM

## 2018-08-05 DIAGNOSIS — I2510 Atherosclerotic heart disease of native coronary artery without angina pectoris: Secondary | ICD-10-CM | POA: Diagnosis not present

## 2018-08-05 DIAGNOSIS — I10 Essential (primary) hypertension: Secondary | ICD-10-CM

## 2018-08-05 DIAGNOSIS — Z9989 Dependence on other enabling machines and devices: Secondary | ICD-10-CM | POA: Diagnosis not present

## 2018-08-05 DIAGNOSIS — E785 Hyperlipidemia, unspecified: Secondary | ICD-10-CM

## 2018-08-05 MED ORDER — CARVEDILOL 6.25 MG PO TABS: 6.2500 mg | ORAL_TABLET | Freq: Two times a day (BID) | ORAL | 3 refills | Status: DC

## 2018-08-05 MED ORDER — TICAGRELOR 90 MG PO TABS: 90.0000 mg | ORAL_TABLET | Freq: Two times a day (BID) | ORAL | 11 refills | Status: DC

## 2018-08-05 NOTE — Progress Notes (Signed)
Brilinta 90 mg bid has been sent in for a refill.

## 2018-08-05 NOTE — Patient Instructions (Signed)
Medication Instructions:  Your physician has recommended you make the following change in your medication:  1- INCREASE Carvedilol to 6.25 mg by mouth two times a day.  If you need a refill on your cardiac medications before your next appointment, please call your pharmacy.   Lab work: Your physician recommends that you return for lab work in: 8 weeks to check cholesterol and liver. - on or around January 22nd, 2020. - You will need to be FASTING. - Please go to the [facility]. You will check in at the front desk to the right as you walk into the atrium. Valet Parking is offered if needed.    If you have labs (blood work) drawn today and your tests are completely normal, you will receive your results only by: Marland Kitchen MyChart Message (if you have MyChart) OR . A paper copy in the mail If you have any lab test that is abnormal or we need to change your treatment, we will call you to review the results.  Testing/Procedures: Your physician has requested that you have a carotid duplex. This test is an ultrasound of the carotid arteries in your neck. It looks at blood flow through these arteries that supply the brain with blood. Allow one hour for this exam. There are no restrictions or special instructions.    Follow-Up: At [facility], you and your health needs are our priority.  As part of our continuing mission to provide you with exceptional heart care, we have created designated Provider Care Teams.  These Care Teams include your primary Cardiologist (physician) and Advanced Practice Providers (APPs -  Physician Assistants and Nurse Practitioners) who all work together to provide you with the care you need, when you need it. You will need a follow up appointment in 1 months.  Please call our office 2 months in advance to schedule this appointment.  You may see DR ARIDA OR DR END (ONLY).

## 2018-08-26 ENCOUNTER — Ambulatory Visit (INDEPENDENT_AMBULATORY_CARE_PROVIDER_SITE_OTHER): Payer: PPO

## 2018-08-26 ENCOUNTER — Encounter

## 2018-08-26 ENCOUNTER — Telehealth: Payer: Self-pay

## 2018-08-26 DIAGNOSIS — R202 Paresthesia of skin: Secondary | ICD-10-CM | POA: Diagnosis not present

## 2018-08-26 NOTE — Telephone Encounter (Signed)
Results from carotid ultrasound discussed w pt. He verbalized understanding. No further questions.   Advised pt to call for any further questions or concerns

## 2018-08-26 NOTE — Telephone Encounter (Signed)
-----   Message from Rise Mu, PA-C sent at 08/26/2018  3:18 PM EST ----- Carotid artery ultrasound showed near normal bilateral carotid arteries with only minimal wall thickening or plaque.  Reassuring study.  If he is still having symptoms, follow up with PCP.

## 2018-08-28 ENCOUNTER — Ambulatory Visit (INDEPENDENT_AMBULATORY_CARE_PROVIDER_SITE_OTHER): Payer: PPO | Admitting: Cardiovascular Disease

## 2018-08-28 ENCOUNTER — Encounter: Payer: Self-pay | Admitting: Cardiovascular Disease

## 2018-08-28 VITALS — BP 138/80 | HR 80

## 2018-08-28 DIAGNOSIS — E785 Hyperlipidemia, unspecified: Secondary | ICD-10-CM | POA: Diagnosis not present

## 2018-08-28 DIAGNOSIS — I2510 Atherosclerotic heart disease of native coronary artery without angina pectoris: Secondary | ICD-10-CM | POA: Diagnosis not present

## 2018-08-28 DIAGNOSIS — I10 Essential (primary) hypertension: Secondary | ICD-10-CM

## 2018-08-28 MED ORDER — CLOPIDOGREL BISULFATE 75 MG PO TABS: 75.0000 mg | ORAL_TABLET | Freq: Every day | ORAL | 3 refills | Status: DC

## 2018-08-28 NOTE — Patient Instructions (Signed)
Medication Instructions:  STOP the Brilinta when it runs out.  When the Brilinta runs out, start Plavix 75 mg daily.  -on the first day that you start the Plavix, please take 2 tablets (150 mg total) then starting on day 2 you will take a 75 mg tablet daily.  If you need a refill on your cardiac medications before your next appointment, please call your pharmacy.   Lab work: None ordered  Testing/Procedures: None ordered  Follow-Up: At [facility], you and your health needs are our priority.  As part of our continuing mission to provide you with exceptional heart care, we have created designated Provider Care Teams.  These Care Teams include your primary Cardiologist (physician) and Advanced Practice Providers (APPs -  Physician Assistants and Nurse Practitioners) who all work together to provide you with the care you need, when you need it. You will need a follow up appointment in 3 months.  You may see Dr. Fletcher Anon or one of the following Advanced Practice Providers on your designated Care Team:   Murray Hodgkins, NP Christell Faith, PA-C . Marrianne Mood, PA-C  Any Other Special Instructions Will Be Listed Below (If Applicable). Please be fasting (nothing after midnight) for your follow up appointment with Dr. Fletcher Anon. We will be drawing labs that day.

## 2018-08-28 NOTE — Progress Notes (Signed)
Cardiology Office Note   Date:  08/28/2018   ID:  Steve Howell, DOB [birth year], MRN [id number]  PCP:  Guadalupe Maple, MD  Cardiologist:   Kathlyn Sacramento, MD   Chief Complaint  Patient presents with  . other    1 mo f/u. Medications reviewed verbally.       History of Present Illness: Steve Howell is a 62 y.o. male who presents for a follow-up visit regarding coronary artery disease.  He has chronic medical conditions that include hypertension, hyperlipidemia, nephrolithiasis and obstructive sleep apnea. He was hospitalized in November with non-STEMI.  Cardiac catheterization showed severe single-vessel coronary artery disease affecting the distal RCA.  This was treated with PCI and drug-eluting stent placement.  No other obstructive disease.  Ejection fraction was normal. He has been doing well with no recent chest pain, shortness of breath or palpitations.  He takes his medications regularly. He is building a house in [place] and is planning to move there once finished.   Past Medical History:  Diagnosis Date  . Arthritis    "hands, was in my knees" (07/28/2018)  . Chronic kidney disease   . Coronary artery disease    a. cath severe dRCA stenosis s/p PTCA/DES x 1 07/28/18  . GERD (gastroesophageal reflux disease)   . Glaucoma, right eye   . History of kidney stones   . Hypertension    hx of, no medications needed now  . OSA on CPAP   . Pneumonia    "couple times; nothing in more than 10 yrs" (07/28/2018)  . Wears glasses     Past Surgical History:  Procedure Laterality Date  . BACK SURGERY    . CATARACT EXTRACTION W/ INTRAOCULAR LENS IMPLANT Right 2015  . CATARACT EXTRACTION W/ INTRAOCULAR LENS IMPLANT Right   . COLONOSCOPY    . CORONARY ANGIOPLASTY WITH STENT PLACEMENT  07/28/2018  . CORONARY STENT INTERVENTION N/A 07/28/2018   Procedure: CORONARY STENT INTERVENTION;  Surgeon: Burnell Blanks, MD;  Location: [facility];  Service:  Cardiovascular;  Laterality: N/A;  . CYSTOSCOPY WITH RETROGRADE PYELOGRAM, URETEROSCOPY AND STENT PLACEMENT Bilateral 07/08/2014   Procedure: CYSTOSCOPY WITH BILATERAL RETROGRADE PYELOGRAM, right STENT PLACEMENT;  Surgeon: Festus Aloe, MD;  Location: [facility];  Service: Urology;  Laterality: Bilateral;  . CYSTOSCOPY WITH URETEROSCOPY AND STENT PLACEMENT Right 07/29/2014   Procedure: CYSTO  WITH RIGHT URETEROSCOPY/LITHOTRIPSY/STENT rePLACEMENT;  Surgeon: Festus Aloe, MD;  Location: [facility];  Service: Urology;  Laterality: Right;  . EYE SURGERY    . GLAUCOMA SURGERY Right 09/2015  . HOLMIUM LASER APPLICATION Right 84/[id number]/6295   Procedure: HOLMIUM LASER APPLICATION;  Surgeon: Festus Aloe, MD;  Location: [facility];  Service: Urology;  Laterality: Right;  . LEFT HEART CATH AND CORONARY ANGIOGRAPHY N/A 07/28/2018   Procedure: LEFT HEART CATH AND CORONARY ANGIOGRAPHY;  Surgeon: Burnell Blanks, MD;  Location: [facility];  Service: Cardiovascular;  Laterality: N/A;  . LUMBAR DISC SURGERY  2000   L4-5  . PHOTOCOAGULATION WITH LASER Right 11/04/2016   Procedure: transcleral diode cyclophotocoagulation;  Surgeon: Ronnell Freshwater, MD;  Location: [facility];  Service: Ophthalmology;  Laterality: Right;  . REFRACTIVE SURGERY Right    "multiple times"  . RETINAL DETACHMENT SURGERY Right 04/04/2014   2015  . TONSILLECTOMY       Current Outpatient Medications  Medication Sig Dispense Refill  . aspirin EC 81 MG EC tablet Take 1  tablet (81 mg total) by mouth daily.    Marland Kitchen atorvastatin (LIPITOR) 80 MG tablet Take 1 tablet (80 mg total) by mouth daily at 6 PM. 30 tablet 11  . brimonidine-timolol (COMBIGAN) 0.2-0.5 % ophthalmic solution Place 1 drop into the right eye every 12 (twelve) hours.    . carvedilol (COREG) 6.25 MG tablet Take 1 tablet (6.25 mg total) by mouth 2 (two) times daily. 180 tablet 3  .  LATANOPROST OP Apply 1 drop to eye. Right eye each night    . nitroGLYCERIN (NITROSTAT) 0.4 MG SL tablet Place 1 tablet (0.4 mg total) under the tongue every 5 (five) minutes x 3 doses as needed for chest pain. 25 tablet 12  . clopidogrel (PLAVIX) 75 MG tablet Take 1 tablet (75 mg total) by mouth daily. 90 tablet 3   No current facility-administered medications for this visit.     Allergies:   Acetazolamide; Brimonidine tartrate-timolol; Other; and Brinzolamide-brimonidine    Social History:  The patient  reports that he quit smoking about 24 years ago. His smoking use included cigarettes. He has a 20.00 pack-year smoking history. He has never used smokeless tobacco. He reports current alcohol use of about 17.0 standard drinks of alcohol per week. He reports that he does not use drugs.   Family History:  The patient's family history is not on file.    ROS:  Please see the history of present illness.   Otherwise, review of systems are positive for none.   All other systems are reviewed and negative.    PHYSICAL EXAM: VS:  BP 138/80 (BP Location: Left Arm, Patient Position: Sitting, Cuff Size: Normal)   Pulse 80  , BMI There is no height or weight on file to calculate BMI. GEN: Well nourished, well developed, in no acute distress  HEENT: normal  Neck: no JVD, carotid bruits, or masses Cardiac: RRR; no murmurs, rubs, or gallops,no edema  Respiratory:  clear to auscultation bilaterally, normal work of breathing GI: soft, nontender, nondistended, + BS MS: no deformity or atrophy  Skin: warm and dry, no rash Neuro:  Strength and sensation are intact Psych: euthymic mood, full affect   EKG:  EKG is ordered today. The ekg ordered today demonstrates normal sinus rhythm with incomplete right bundle branch block.  Inferior T wave changes suggestive of ischemia.   Recent Labs: 03/31/2018: TSH 1.630 07/28/2018: ALT 38; B Natriuretic Peptide 47.5; Magnesium 2.2 07/29/2018: BUN 10;  Creatinine, Ser 0.98; Hemoglobin 15.3; Platelets 211; Potassium 3.5; Sodium 137    Lipid Panel    Component Value Date/Time   CHOL 156 07/29/2018 0357   CHOL 199 03/31/2018 1515   TRIG 232 (H) 07/29/2018 0357   HDL 27 (L) 07/29/2018 0357   HDL 32 (L) 03/31/2018 1515   CHOLHDL 5.8 07/29/2018 0357   VLDL 46 (H) 07/29/2018 0357   LDLCALC 83 07/29/2018 0357   LDLCALC 100 (H) 03/31/2018 1515      Wt Readings from Last 3 Encounters:  08/05/18 206 lb (93.4 kg)  07/29/18 206 lb 12.7 oz (93.8 kg)  03/31/18 205 lb (93 kg)       No flowsheet data found.    ASSESSMENT AND PLAN:  1.  Coronary artery disease involving native coronary arteries without angina: He is doing well overall with no anginal symptoms.  Continue medical therapy.  He requested changing Brilinta to another antiplatelet medication due to cost.  I elected to switch him to clopidogrel 75 mg once daily after he  finishes 1 more month of Brilinta.  2.  Hyperlipidemia: Continue treatment with high-dose atorvastatin.  I will recheck lipid and liver profile with his next visit in 3 months.  3.  Essential hypertension: Blood pressure is reasonably controlled on carvedilol.    Disposition:   FU with me in 3 months  Signed,  Kathlyn Sacramento, MD  08/28/2018 1:15 PM    [facility]

## 2018-11-30 ENCOUNTER — Telehealth: Payer: Self-pay | Admitting: Cardiovascular Disease

## 2018-11-30 DIAGNOSIS — E785 Hyperlipidemia, unspecified: Secondary | ICD-10-CM

## 2018-11-30 DIAGNOSIS — I2510 Atherosclerotic heart disease of native coronary artery without angina pectoris: Secondary | ICD-10-CM

## 2018-11-30 DIAGNOSIS — I10 Essential (primary) hypertension: Secondary | ICD-10-CM

## 2018-11-30 NOTE — Telephone Encounter (Signed)
Patient called to cancel 3/26 appointment. Please call to discuss rescheduling.

## 2018-11-30 NOTE — Telephone Encounter (Signed)
   Cardiac Questionnaire:    Since your last visit or hospitalization:    1. Have you been having new or worsening chest pain? No   2. Have you been having new or worsening shortness of breath? No 3. Have you been having new or worsening leg swelling, wt gain, or increase in abdominal girth (pants fitting more tightly)? No   4. Have you had any passing out spells? No     Primary Cardiologist:  Kathlyn Sacramento, MD  Patient contacted.  History reviewed.  No symptoms to suggest any unstable cardiac conditions.  Based on discussion, with current pandemic situation, we will be postponing this appointment for the patient.  If symptoms change, he has been instructed to contact our office.   Routing to C19 CANCEL pool for tracking (P CV DIV CV19 CANCEL) and assigning priority (1 = 4-6 wks, 2 = 6-12 wks, 3 = >12 wks).  Ricci Barker, RN  11/30/2018 3:11 PM         .

## 2018-12-03 ENCOUNTER — Ambulatory Visit: Payer: PPO | Admitting: Cardiovascular Disease

## 2018-12-04 ENCOUNTER — Telehealth: Payer: Self-pay

## 2018-12-09 ENCOUNTER — Ambulatory Visit: Payer: Self-pay | Admitting: Pharmacist

## 2018-12-09 DIAGNOSIS — I214 Non-ST elevation (NSTEMI) myocardial infarction: Secondary | ICD-10-CM

## 2018-12-09 DIAGNOSIS — I10 Essential (primary) hypertension: Secondary | ICD-10-CM

## 2018-12-09 DIAGNOSIS — H409 Unspecified glaucoma: Secondary | ICD-10-CM

## 2018-12-09 NOTE — Addendum Note (Signed)
Addended by: De Hollingshead on: 12/09/2018 11:58 AM   Modules accepted: Orders

## 2018-12-09 NOTE — Chronic Care Management (AMB) (Signed)
  Chronic Care Management   Telephone Outreach Note  12/09/2018 Name: Steve Howell MRN: [id number] DOB: June 06, 1956  Referred by: patient's health plan.   I reached out to Mr. Steve Howell today by phone in response to a referral sent by Mr. Steve Howell [facility] health plan. Steve Howell and I briefly discussed care management needs related to hx NSTEMI, unstable angina, OSA, insomnia, glaucoma.   Steve Howell was given information about Chronic Care Management services today including:  1. CCM service includes personalized support from designated clinical staff supervised by his physician, including individualized plan of care and coordination with other care providers 2. 24/7 contact phone numbers for assistance for urgent and routine care needs. 3. Service will only be billed when office clinical staff spend 20 minutes or more in a month to coordinate care. 4. Only one practitioner may furnish and bill the service in a calendar month. 5. The patient may stop CCM services at any time (effective at the end of the month) by phone call to the office staff. 6. The patient will be responsible for cost sharing (co-pay) of up to 20% of the service fee (after annual deductible is met).  Patient did not agree to services and does not wish to consider at this time.  Patient states that he feels that he is managing his conditions well right now. He is living at his home in [place] right now, given the coronavirus pandemic, and all of this other healthcare provider appointments (opthalalmology, cardiology) have been canceled. He notes that he will reach back out to the office in the future if he is interested in the program moving forward.    Guadalupe Maple, MD has been notified of this outreach and [facility] decision and plan.   Catie Darnelle Maffucci, PharmD Clinical Pharmacist [place] [phone]

## 2018-12-10 NOTE — Telephone Encounter (Signed)
LMOVM to CB

## 2018-12-17 NOTE — Telephone Encounter (Signed)
lmov to schedule Evisit  °

## 2018-12-17 NOTE — Telephone Encounter (Signed)
Patient returning call  States he does not want to schedule an evisit right now, doesn't feel it will do any good He wants to know what to do about his blood work  Patient states he has been having some dizzy spells and wants to speak with a nurse  Please call to discuss

## 2018-12-17 NOTE — Telephone Encounter (Addendum)
Spoke with the patient. He reports for the last 3-4 weeks he has been having feelings of dizziness not necessarily related to positional changes. He does not feel like the room is spinning. "it feels more like an imbalance." At times he is dizzy with standing and feels like he might pass out. Other times his dizziness is worse when he sits.  He denies any recent med change, illness, or weight loss.  Patient was previously on coreg 3.125 mg BID and then Christell Faith, PA increased this to 6.25 mg BID on 08/05/18 for better BP control.  BP (HR) this morning was 120/77 (63). He states this is really good for him and he was feeling good when he checked his BP this morning  He has been intermittently dizzy as the day has gone on. [place] and having [place] look up quite a bit. Carotid US done on 08/2018 showed near normal carotid arteries bilaterally.  He checked his BP while on the phone with me and he was 143/88 (64).  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 I have enoucouraged him to check his BP at other times when he feels dizzy.  He would like to know if Dr.Arida is ok with him cutting the coreg in 1/2 to see how he does. I advised this sounds reasonable, but will get MD approval.   I did not confirm if he was still taking ASA & plavix- ? If CBC would be warranted.   The patient is aware we will call him back with MD recommendations and is agreeable.  He is aware that his lipid/ liver panel can wait to be drawn post-covid and hopefully everything else can be managed by phone although he is currently not in favor of an E-visit.

## 2018-12-18 NOTE — Addendum Note (Signed)
Addended by: Ricci Barker on: 12/18/2018 05:24 PM   Modules accepted: Orders

## 2018-12-18 NOTE — Telephone Encounter (Signed)
Left a message for the patient to call back.  

## 2018-12-18 NOTE — Telephone Encounter (Signed)
Yes it is fine to decrease carvedilol to 3.125 mg twice daily.  I do recommend that he gets labs done in the next few weeks including CBC, basic metabolic profile.  Lipid and liver profile can be done at the same time.

## 2018-12-21 MED ORDER — CARVEDILOL 3.125 MG PO TABS: 3.1250 mg | ORAL_TABLET | Freq: Two times a day (BID) | ORAL | Status: DC

## 2018-12-21 NOTE — Addendum Note (Signed)
Addended by: Ricci Barker on: 12/21/2018 10:21 AM   Modules accepted: Orders

## 2018-12-21 NOTE — Telephone Encounter (Signed)
Patient has been made aware and verbalized his understanding.

## 2019-02-09 ENCOUNTER — Telehealth: Payer: Self-pay | Admitting: Cardiovascular Disease

## 2019-02-09 DIAGNOSIS — I2510 Atherosclerotic heart disease of native coronary artery without angina pectoris: Secondary | ICD-10-CM

## 2019-02-09 NOTE — Telephone Encounter (Signed)
Patient calling  Patient had an appointment scheduled in March but was cancelled due to [place] Patient would like to reschedule to be seen but states he would need to have his fasting labs first Please advise if labs can be switched to [facility] so he may schedule an appointment with Dr Fletcher Anon  Patient will be going out of town sometime tomorrow - would like a call before then if possible

## 2019-02-09 NOTE — Telephone Encounter (Signed)
Call to patient to let him know that I have switched all lab orders to [facility] draw.   He states that he will go tomorrow and have labs taken.   After he get back from vacation, he will call our office and get appt set with Dr. Fletcher Anon.   Advised pt to call for any further questions or concerns.

## 2019-02-10 ENCOUNTER — Other Ambulatory Visit
Admission: RE | Admit: 2019-02-10 | Discharge: 2019-02-10 | Disposition: A | Discharge status: Home / Self Care | Payer: PPO | Attending: Cardiovascular Disease | Admitting: Cardiovascular Disease

## 2019-02-10 DIAGNOSIS — I2510 Atherosclerotic heart disease of native coronary artery without angina pectoris: Secondary | ICD-10-CM | POA: Insufficient documentation

## 2019-02-10 LAB — CBC
HCT: 46.1 % (ref 39.0–52.0)
Hemoglobin: 15.8 g/dL (ref 13.0–17.0)
MCH: 32.2 pg (ref 26.0–34.0)
MCHC: 34.3 g/dL (ref 30.0–36.0)
MCV: 93.9 fL (ref 80.0–100.0)
Platelets: 192 10*3/uL (ref 150–400)
RBC: 4.91 MIL/uL (ref 4.22–5.81)
RDW: 12.5 % (ref 11.5–15.5)
WBC: 6.7 10*3/uL (ref 4.0–10.5)
nRBC: 0 % (ref 0.0–0.2)

## 2019-02-10 LAB — LIPID PANEL
Cholesterol: 104 mg/dL (ref 0–200)
HDL: 30 mg/dL — ABNORMAL LOW (ref >40)
LDL Cholesterol: 43 mg/dL (ref 0–99)
Total CHOL/HDL Ratio: 3.5 RATIO
Triglycerides: 154 mg/dL — ABNORMAL HIGH (ref <150)
VLDL: 31 mg/dL (ref 0–40)

## 2019-02-10 LAB — BASIC METABOLIC PANEL
Anion gap: 7 (ref 5–15)
BUN: 14 mg/dL (ref 8–23)
CO2: 26 mmol/L (ref 22–32)
Calcium: 8.7 mg/dL — ABNORMAL LOW (ref 8.9–10.3)
Chloride: 105 mmol/L (ref 98–111)
Creatinine, Ser: 0.83 mg/dL (ref 0.61–1.24)
GFR calc Af Amer: >60 mL/min (ref >60)
GFR calc non Af Amer: >60 mL/min (ref >60)
Glucose, Bld: 145 mg/dL — ABNORMAL HIGH (ref 70–99)
Potassium: 4.4 mmol/L (ref 3.5–5.1)
Sodium: 138 mmol/L (ref 135–145)

## 2019-02-10 LAB — HEPATIC FUNCTION PANEL
ALT: 29 U/L (ref 0–44)
AST: 21 U/L (ref 15–41)
Albumin: 4.3 g/dL (ref 3.5–5.0)
Alkaline Phosphatase: 75 U/L (ref 38–126)
Bilirubin, Direct: 0.2 mg/dL (ref 0.0–0.2)
Indirect Bilirubin: 0.7 mg/dL (ref 0.3–0.9)
Total Bilirubin: 0.9 mg/dL (ref 0.3–1.2)
Total Protein: 6.9 g/dL (ref 6.5–8.1)

## 2019-02-16 ENCOUNTER — Telehealth: Payer: Self-pay | Admitting: Cardiovascular Disease

## 2019-02-16 NOTE — Telephone Encounter (Signed)
Spoke with the pt and made him aware of lab results.  Notes recorded by Wellington Hampshire, MD on 02/11/2019 at 12:01 PM EDT Inform patient that labs were normal. Cholesterol was excellent but triglyceride was borderline elevated. He should work on improving his heart healthy diet. No need to add another medication for now  Adv the pt that a copy of the results have been mailed to him. Pt verbalized understanding and voiced appreciation for the call.

## 2019-02-16 NOTE — Telephone Encounter (Signed)
Please call with lab results 

## 2019-03-31 DIAGNOSIS — H401313 Pigmentary glaucoma, right eye, severe stage: Secondary | ICD-10-CM | POA: Diagnosis not present

## 2019-03-31 DIAGNOSIS — H21232 Degeneration of iris (pigmentary), left eye: Secondary | ICD-10-CM | POA: Diagnosis not present

## 2019-05-06 ENCOUNTER — Other Ambulatory Visit: Payer: Self-pay

## 2019-05-06 ENCOUNTER — Encounter: Payer: Self-pay | Admitting: Cardiovascular Disease

## 2019-05-06 ENCOUNTER — Ambulatory Visit (INDEPENDENT_AMBULATORY_CARE_PROVIDER_SITE_OTHER): Payer: PPO | Admitting: Cardiovascular Disease

## 2019-05-06 VITALS — BP 130/80 | HR 79 | Ht 71.0 in | Wt 209.2 lb

## 2019-05-06 DIAGNOSIS — E785 Hyperlipidemia, unspecified: Secondary | ICD-10-CM | POA: Diagnosis not present

## 2019-05-06 DIAGNOSIS — I2510 Atherosclerotic heart disease of native coronary artery without angina pectoris: Secondary | ICD-10-CM

## 2019-05-06 DIAGNOSIS — I10 Essential (primary) hypertension: Secondary | ICD-10-CM

## 2019-05-06 MED ORDER — CLOPIDOGREL BISULFATE 75 MG PO TABS: 75.0000 mg | ORAL_TABLET | Freq: Every day | ORAL | 3 refills | Status: DC

## 2019-05-06 MED ORDER — CARVEDILOL 3.125 MG PO TABS: 3.1250 mg | ORAL_TABLET | Freq: Two times a day (BID) | ORAL | 3 refills | Status: DC

## 2019-05-06 MED ORDER — NITROGLYCERIN 0.4 MG SL SUBL: 0.4000 mg | SUBLINGUAL_TABLET | SUBLINGUAL | 3 refills | Status: DC | PRN

## 2019-05-06 NOTE — Patient Instructions (Signed)
Medication Instructions:  Your physician recommends that you continue on your current medications as directed. Please refer to the Current Medication list given to you today.  Your cardiac medication have been refilled  If you need a refill on your cardiac medications before your next appointment, please call your pharmacy.   Lab work: None ordered If you have labs (blood work) drawn today and your tests are completely normal, you will receive your results only by: Marland Kitchen MyChart Message (if you have MyChart) OR . A paper copy in the mail If you have any lab test that is abnormal or we need to change your treatment, we will call you to review the results.  Testing/Procedures: None ordered  Follow-Up: At [facility], you and your health needs are our priority.  As part of our continuing mission to provide you with exceptional heart care, we have created designated Provider Care Teams.  These Care Teams include your primary Cardiologist (physician) and Advanced Practice Providers (APPs -  Physician Assistants and Nurse Practitioners) who all work together to provide you with the care you need, when you need it. You will need a follow up appointment in 6 months.  Please call our office 2 months in advance to schedule this appointment.  You may see  Dr.Arida  or one of the following Advanced Practice Providers on your designated Care Team:   Murray Hodgkins, NP Christell Faith, PA-C . Marrianne Mood, PA-C  Any Other Special Instructions Will Be Listed Below (If Applicable). N/A

## 2019-05-06 NOTE — Progress Notes (Signed)
Cardiology Office Note   Date:  05/06/2019   ID:  Steve Howell, DOB 12-Apr-1956, MRN [id number]  PCP:  Guadalupe Maple, MD  Cardiologist:   Kathlyn Sacramento, MD   Chief Complaint  Patient presents with  . Other    Patient c/o some SOB and chest pain. Meds reviewed verbally with patient.       History of Present Illness: Steve Howell is a 63 y.o. male who presents for a follow-up visit regarding coronary artery disease.  He has chronic medical conditions that include hypertension, hyperlipidemia, nephrolithiasis and obstructive sleep apnea. He was hospitalized in November, 2019 with non-STEMI.  Cardiac catheterization showed severe single-vessel coronary artery disease affecting the distal RCA.  This was treated with PCI and drug-eluting stent placement.  No other obstructive disease.  Ejection fraction was normal. He has been doing well with no recent chest pain, shortness of breath or palpitations.  He takes his medications regularly.  He has been under stress lately as he is going through divorce.  He will be moving to his new house in [place] in the near future.  He reports no side effects with medications.   Past Medical History:  Diagnosis Date  . Arthritis    "hands, was in my knees" (07/28/2018)  . Chronic kidney disease   . Coronary artery disease    a. cath severe dRCA stenosis s/p PTCA/DES x 1 07/28/18  . GERD (gastroesophageal reflux disease)   . Glaucoma, right eye   . History of kidney stones   . Hypertension    hx of, no medications needed now  . OSA on CPAP   . Pneumonia    "couple times; nothing in more than 10 yrs" (07/28/2018)  . Wears glasses     Past Surgical History:  Procedure Laterality Date  . BACK SURGERY    . CATARACT EXTRACTION W/ INTRAOCULAR LENS IMPLANT Right 2015  . CATARACT EXTRACTION W/ INTRAOCULAR LENS IMPLANT Right   . COLONOSCOPY    . CORONARY ANGIOPLASTY WITH STENT PLACEMENT  07/28/2018  . CORONARY STENT INTERVENTION N/A  07/28/2018   Procedure: CORONARY STENT INTERVENTION;  Surgeon: Burnell Blanks, MD;  Location: [facility];  Service: Cardiovascular;  Laterality: N/A;  . CYSTOSCOPY WITH RETROGRADE PYELOGRAM, URETEROSCOPY AND STENT PLACEMENT Bilateral 07/08/2014   Procedure: CYSTOSCOPY WITH BILATERAL RETROGRADE PYELOGRAM, right STENT PLACEMENT;  Surgeon: Festus Aloe, MD;  Location: [facility];  Service: Urology;  Laterality: Bilateral;  . CYSTOSCOPY WITH URETEROSCOPY AND STENT PLACEMENT Right 07/29/2014   Procedure: CYSTO  WITH RIGHT URETEROSCOPY/LITHOTRIPSY/STENT rePLACEMENT;  Surgeon: Festus Aloe, MD;  Location: [facility];  Service: Urology;  Laterality: Right;  . EYE SURGERY    . GLAUCOMA SURGERY Right 09/2015  . HOLMIUM LASER APPLICATION Right [id number]   Procedure: HOLMIUM LASER APPLICATION;  Surgeon: Festus Aloe, MD;  Location: [facility];  Service: Urology;  Laterality: Right;  . LEFT HEART CATH AND CORONARY ANGIOGRAPHY N/A 07/28/2018   Procedure: LEFT HEART CATH AND CORONARY ANGIOGRAPHY;  Surgeon: Burnell Blanks, MD;  Location: [place];  Service: Cardiovascular;  Laterality: N/A;  . LUMBAR DISC SURGERY  2000   L4-5  . PHOTOCOAGULATION WITH LASER Right 11/04/2016   Procedure: transcleral diode cyclophotocoagulation;  Surgeon: Ronnell Freshwater, MD;  Location: [place];  Service: Ophthalmology;  Laterality: Right;  . REFRACTIVE SURGERY Right    "multiple times"  . RETINAL DETACHMENT SURGERY Right 04/04/2014  2015  . TONSILLECTOMY       Current Outpatient Medications  Medication Sig Dispense Refill  . aspirin EC 81 MG EC tablet Take 1 tablet (81 mg total) by mouth daily.    . carvedilol (COREG) 3.125 MG tablet Take 1 tablet (3.125 mg total) by mouth 2 (two) times daily.    . clopidogrel (PLAVIX) 75 MG tablet Take 1 tablet (75 mg total) by mouth daily. 90 tablet 3  . LATANOPROST OP  Apply 1 drop to eye. Right eye each night    . nitroGLYCERIN (NITROSTAT) 0.4 MG SL tablet Place 1 tablet (0.4 mg total) under the tongue every 5 (five) minutes x 3 doses as needed for chest pain. 25 tablet 12  . brimonidine-timolol (COMBIGAN) 0.2-0.5 % ophthalmic solution Place 1 drop into the right eye every 12 (twelve) hours.     No current facility-administered medications for this visit.     Allergies:   Acetazolamide, Brimonidine tartrate-timolol, Other, and Brinzolamide-brimonidine    Social History:  The patient  reports that he quit smoking about 24 years ago. His smoking use included cigarettes. He has a 20.00 pack-year smoking history. He has never used smokeless tobacco. He reports current alcohol use of about 17.0 standard drinks of alcohol per week. He reports that he does not use drugs.   Family History:  The patient's family history is not on file.    ROS:  Please see the history of present illness.   Otherwise, review of systems are positive for none.   All other systems are reviewed and negative.    PHYSICAL EXAM: VS:  BP 130/80 (BP Location: Left Arm, Patient Position: Sitting, Cuff Size: Normal)   Pulse 79   Ht 5\' 11"  (1.803 m)   Wt 209 lb 4 oz (94.9 kg)   BMI 29.18 kg/m  , BMI Body mass index is 29.18 kg/m. GEN: Well nourished, well developed, in no acute distress  HEENT: normal  Neck: no JVD, carotid bruits, or masses Cardiac: RRR; no murmurs, rubs, or gallops,no edema  Respiratory:  clear to auscultation bilaterally, normal work of breathing GI: soft, nontender, nondistended, + BS MS: no deformity or atrophy  Skin: warm and dry, no rash Neuro:  Strength and sensation are intact Psych: euthymic mood, full affect   EKG:  EKG is ordered today. The ekg ordered today demonstrates normal sinus rhythm with no significant ST or T wave changes.   Recent Labs: 07/28/2018: B Natriuretic Peptide 47.5; Magnesium 2.2 02/10/2019: ALT 29; BUN 14; Creatinine, Ser 0.83;  Hemoglobin 15.8; Platelets 192; Potassium 4.4; Sodium 138    Lipid Panel    Component Value Date/Time   CHOL 104 02/10/2019 0924   CHOL 199 03/31/2018 1515   TRIG 154 (H) 02/10/2019 0924   HDL 30 (L) 02/10/2019 0924   HDL 32 (L) 03/31/2018 1515   CHOLHDL 3.5 02/10/2019 0924   VLDL 31 02/10/2019 0924   LDLCALC 43 02/10/2019 0924   LDLCALC 100 (H) 03/31/2018 1515      Wt Readings from Last 3 Encounters:  05/06/19 209 lb 4 oz (94.9 kg)  08/05/18 206 lb (93.4 kg)  07/29/18 206 lb 12.7 oz (93.8 kg)       No flowsheet data found.    ASSESSMENT AND PLAN:  1.  Coronary artery disease involving native coronary arteries without angina: He is doing well overall with no anginal symptoms.  Continue medical therapy.  Continue dual antiplatelet therapy at least until the end of this year  but preferably for a total of 2 years from the time of his myocardial infarction.   2.  Hyperlipidemia: Continue treatment with high-dose atorvastatin.  Recent lipid profile showed an LDL of 43.  His triglyceride was borderline elevated at 154 and we have discussed with him the importance of healthy diet.  3.  Essential hypertension: Blood pressure is reasonably controlled on carvedilol.    Disposition:   FU with me in 6 months  Signed,  Kathlyn Sacramento, MD  05/06/2019 4:42 PM    [place]

## 2019-05-24 DIAGNOSIS — H21232 Degeneration of iris (pigmentary), left eye: Secondary | ICD-10-CM | POA: Diagnosis not present

## 2019-05-24 DIAGNOSIS — H401313 Pigmentary glaucoma, right eye, severe stage: Secondary | ICD-10-CM | POA: Diagnosis not present

## 2019-06-11 ENCOUNTER — Other Ambulatory Visit: Payer: Self-pay | Admitting: Physician Assistant

## 2019-07-05 ENCOUNTER — Other Ambulatory Visit: Payer: Self-pay

## 2019-07-05 NOTE — Telephone Encounter (Signed)
He should be on high-dose atorvastatin.

## 2019-07-05 NOTE — Telephone Encounter (Signed)
Returned the patient's call. Lmtcb. Patient will need to provide his local pharmacy in [place].  Confirmed with Dr.Arida Atorvastatin 80mg  daily. The patient has relocated to [place]. Dr. Fletcher Anon has given the ok to refill for 6 months, patient's new cardiologist will need to continue the prescription.

## 2019-07-05 NOTE — Telephone Encounter (Signed)
Please call to discuss Atorvastatin. Patient is not sure if he should be taking it.

## 2019-07-05 NOTE — Telephone Encounter (Signed)
When the patient was seen in Aug 2020 Dr. Fletcher Anon documented that he should stay on his high dose statin. There is not statin medication listed on the patient's current medication list. The patient was on Atorvatstatin 80mg  qd previously. Message fwd to Dr. Fletcher Anon to advise.

## 2019-07-06 MED ORDER — ATORVASTATIN CALCIUM 80 MG PO TABS: 80.0000 mg | ORAL_TABLET | Freq: Every day | ORAL | 1 refills | Status: DC

## 2019-07-06 NOTE — Telephone Encounter (Signed)
Spoke with the patient and made him aware of Dr. Tyrell Antonio recommendation. Patient rqst that the Rx for Atorvastatin be [facility] to [facility] [place]. Rx sent for Atorvastatin 80mg  qd # 90 R-1. Future refills will need to be sent to the patient's new Cardiologist in [place].

## 2019-07-26 DIAGNOSIS — H21232 Degeneration of iris (pigmentary), left eye: Secondary | ICD-10-CM | POA: Diagnosis not present

## 2019-07-26 DIAGNOSIS — H401313 Pigmentary glaucoma, right eye, severe stage: Secondary | ICD-10-CM | POA: Diagnosis not present

## 2019-12-17 ENCOUNTER — Other Ambulatory Visit: Payer: Self-pay

## 2019-12-17 ENCOUNTER — Ambulatory Visit (INDEPENDENT_AMBULATORY_CARE_PROVIDER_SITE_OTHER): Payer: Medicare PPO | Admitting: Nurse Practitioner

## 2019-12-17 ENCOUNTER — Encounter: Payer: Self-pay | Admitting: Nurse Practitioner

## 2019-12-17 VITALS — BP 130/85 | HR 72 | Ht 71.0 in | Wt 212.8 lb

## 2019-12-17 DIAGNOSIS — I2510 Atherosclerotic heart disease of native coronary artery without angina pectoris: Secondary | ICD-10-CM

## 2019-12-17 DIAGNOSIS — E785 Hyperlipidemia, unspecified: Secondary | ICD-10-CM | POA: Diagnosis not present

## 2019-12-17 DIAGNOSIS — I10 Essential (primary) hypertension: Secondary | ICD-10-CM

## 2019-12-17 MED ORDER — NITROGLYCERIN 0.4 MG SL SUBL: 0.4000 mg | SUBLINGUAL_TABLET | SUBLINGUAL | 3 refills | Status: DC | PRN

## 2019-12-17 MED ORDER — ATORVASTATIN CALCIUM 80 MG PO TABS: 80.0000 mg | ORAL_TABLET | Freq: Every day | ORAL | 1 refills | Status: DC

## 2019-12-17 MED ORDER — CARVEDILOL 3.125 MG PO TABS: 3.1250 mg | ORAL_TABLET | Freq: Two times a day (BID) | ORAL | 3 refills | Status: DC

## 2019-12-17 MED ORDER — CLOPIDOGREL BISULFATE 75 MG PO TABS: 75.0000 mg | ORAL_TABLET | Freq: Every day | ORAL | 3 refills | Status: DC

## 2019-12-17 NOTE — Progress Notes (Signed)
Office Visit    Patient Name: Steve Howell Date of Encounter: 12/17/2019  Primary Care Provider:  No primary care provider on file. Primary Cardiologist:  Kathlyn Sacramento, MD  Chief Complaint    64 year old male with a history of CAD, hypertension, hyperlipidemia, sleep apnea, and nephrolithiasis, who presents for follow-up of CAD.  Past Medical History    Past Medical History:  Diagnosis Date  . Arthritis    "hands, was in my knees" (07/28/2018)  . Coronary artery disease    a. 07/2018 NSTEMI/PCI: LM nl, LAD 25p/m, LCX nl, OM2 20, RCA 95d (2.5x20 Synergy DES), EF 55-65%.  Marland Kitchen GERD (gastroesophageal reflux disease)   . Glaucoma, right eye   . History of kidney stones   . Hypertension    hx of, no medications needed now  . LVH (left ventricular hypertrophy)    a. 07/2018 Echo: EF 60-65%, mild LVH.  . OSA on CPAP   . Pneumonia    "couple times; nothing in more than 10 yrs" (07/28/2018)  . Wears glasses    Past Surgical History:  Procedure Laterality Date  . BACK SURGERY    . CATARACT EXTRACTION W/ INTRAOCULAR LENS IMPLANT Right 2015  . CATARACT EXTRACTION W/ INTRAOCULAR LENS IMPLANT Right   . COLONOSCOPY    . CORONARY ANGIOPLASTY WITH STENT PLACEMENT  07/28/2018  . CORONARY STENT INTERVENTION N/A 07/28/2018   Procedure: CORONARY STENT INTERVENTION;  Surgeon: Burnell Blanks, MD;  Location: [facility];  Service: Cardiovascular;  Laterality: N/A;  . CYSTOSCOPY WITH RETROGRADE PYELOGRAM, URETEROSCOPY AND STENT PLACEMENT Bilateral 07/08/2014   Procedure: CYSTOSCOPY WITH BILATERAL RETROGRADE PYELOGRAM, right STENT PLACEMENT;  Surgeon: Festus Aloe, MD;  Location: [facility];  Service: Urology;  Laterality: Bilateral;  . CYSTOSCOPY WITH URETEROSCOPY AND STENT PLACEMENT Right 07/29/2014   Procedure: CYSTO  WITH RIGHT URETEROSCOPY/LITHOTRIPSY/STENT rePLACEMENT;  Surgeon: Festus Aloe, MD;  Location: [facility];  Service:  Urology;  Laterality: Right;  . EYE SURGERY    . GLAUCOMA SURGERY Right 09/2015  . HOLMIUM LASER APPLICATION Right [id number]   Procedure: HOLMIUM LASER APPLICATION;  Surgeon: Festus Aloe, MD;  Location: [facility];  Service: Urology;  Laterality: Right;  . LEFT HEART CATH AND CORONARY ANGIOGRAPHY N/A 07/28/2018   Procedure: LEFT HEART CATH AND CORONARY ANGIOGRAPHY;  Surgeon: Burnell Blanks, MD;  Location: [place] [facility];  Service: Cardiovascular;  Laterality: N/A;  . LUMBAR DISC SURGERY  2000   L4-5  . PHOTOCOAGULATION WITH LASER Right 11/04/2016   Procedure: transcleral diode cyclophotocoagulation;  Surgeon: Ronnell Freshwater, MD;  Location: [place];  Service: Ophthalmology;  Laterality: Right;  . REFRACTIVE SURGERY Right    "multiple times"  . RETINAL DETACHMENT SURGERY Right 04/04/2014   2015  . TONSILLECTOMY      Allergies  Allergies  Allergen Reactions  . Acetazolamide Other (See Comments)    CAUSED RENAL COLIC Other reaction(s): Other (See Comments) CAUSED RENAL COLIC  . Brimonidine Tartrate-Timolol Itching    Redness in eyes  . Other     Pt is a steroid responder Increases eye pressure- hx of glaucoma  . Brinzolamide-Brimonidine Rash    Facial numbness Other reaction(s): Other (See Comments) Facial numbness    History of Present Illness    64 year old male with above past medical history including CAD, hypertension, hyperlipidemia, nephrolithiasis, and sleep apnea.  He was hospitalized November 2019 with non-STEMI.  Catheterization showed severe distal RCA disease and otherwise  nonobstructive disease and normal LV function.  Echocardiogram at the time showed an EF of 60-65% with mild LVH.  The RCA was successfully treated with a drug-eluting stent and he has been managed with aspirin, statin, beta-blocker, and Plavix therapy since.  He was last seen in clinic in August 2020.  Since then, he has moved to [place].  He is  still in the midst of a divorce.  He remains active and denies chest pain, palpitations, dyspnea, pnd, orthopnea, n, v, dizziness, syncope, edema, weight gain, or early satiety.  He ran out of atorvastatin about 6 weeks ago and needs a refill.    Home Medications    Prior to Admission medications   Medication Sig Start Date End Date Taking? Authorizing Provider  aspirin EC 81 MG EC tablet Take 1 tablet (81 mg total) by mouth daily. 07/30/18   Bhagat, Crista Luria, PA  atorvastatin (LIPITOR) 80 MG tablet Take 1 tablet (80 mg total) by mouth daily. 07/06/19 10/04/19  Wellington Hampshire, MD  brimonidine-timolol (COMBIGAN) 0.2-0.5 % ophthalmic solution Place 1 drop into the right eye every 12 (twelve) hours.    [provider]  carvedilol (COREG) 3.125 MG tablet Take 1 tablet (3.125 mg total) by mouth 2 (two) times daily. 05/06/19   Wellington Hampshire, MD  clopidogrel (PLAVIX) 75 MG tablet Take 1 tablet (75 mg total) by mouth daily. 05/06/19   Wellington Hampshire, MD  LATANOPROST OP Apply 1 drop to eye. Right eye each night    [provider]  nitroGLYCERIN (NITROSTAT) 0.4 MG SL tablet Place 1 tablet (0.4 mg total) under the tongue every 5 (five) minutes x 3 doses as needed for chest pain. 05/06/19   Wellington Hampshire, MD    Review of Systems    He denies chest pain, palpitations, dyspnea, pnd, orthopnea, n, v, dizziness, syncope, edema, weight gain, or early satiety.  All other systems reviewed and are otherwise negative except as noted above.  Physical Exam    VS:  BP 130/85 (BP Location: Left Arm, Patient Position: Sitting, Cuff Size: Normal)   Pulse 72   Ht 5\' 11"  (1.803 m)   Wt 212 lb 12.8 oz (96.5 kg)   BMI 29.68 kg/m  , BMI Body mass index is 29.68 kg/m. GEN: Well nourished, well developed, in no acute distress. HEENT: normal. Neck: Supple, no JVD, carotid bruits, or masses. Cardiac: RRR, no murmurs, rubs, or gallops. No clubbing, cyanosis, edema.  Radials/PT 2+ and equal  bilaterally.  Respiratory:  Respirations regular and unlabored, clear to auscultation bilaterally. GI: Soft, nontender, nondistended, BS + x 4. MS: no deformity or atrophy. Skin: warm and dry, no rash. Neuro:  Strength and sensation are intact. Psych: Normal affect.  Accessory Clinical Findings    ECG personally reviewed by me today -regular sinus rhythm, 72- no acute changes.  Lab Results  Component Value Date   WBC 6.7 02/10/2019   HGB 15.8 02/10/2019   HCT 46.1 02/10/2019   MCV 93.9 02/10/2019   PLT 192 02/10/2019   Lab Results  Component Value Date   CREATININE 0.83 02/10/2019   BUN 14 02/10/2019   NA 138 02/10/2019   K 4.4 02/10/2019   CL 105 02/10/2019   CO2 26 02/10/2019   Lab Results  Component Value Date   ALT 29 02/10/2019   AST 21 02/10/2019   ALKPHOS 75 02/10/2019   BILITOT 0.9 02/10/2019   Lab Results  Component Value Date   CHOL 104 02/10/2019  HDL 30 (L) 02/10/2019   LDLCALC 43 02/10/2019   TRIG 154 (H) 02/10/2019   CHOLHDL 3.5 02/10/2019    Lab Results  Component Value Date   HGBA1C 5.1 07/29/2018    Assessment & Plan    1.  Coronary artery disease: Status post non-STEMI November 2019 with regular stent placement to the RCA.  He has continued to do well without any chest pain or dyspnea.  He remains on aspirin, statin, beta-blocker, and Plavix therapy.  Is completed greater than 12 months of therapy but per recommendation, he will remain on this for 2 years post MRI.  He is interested in discontinuation this coming November.  2.  Essential hypertension: Stable.  3.  Hyperlipidemia: He remains on high potency statin therapy but ran out 4 to 6 weeks ago.  Last LDL was 43 in June with normal LFTs at that time.  I will refill his Lipitor today in he will have follow-up lipids later this year.  4.  Disposition: Follow-up in 6 months or sooner if necessary.   Murray Hodgkins, NP 12/17/2019, 8:46 AM

## 2019-12-17 NOTE — Patient Instructions (Addendum)
Medication Instructions:  1- Your physician recommends that you continue on your current medications as directed. Please refer to the Current Medication list given to you today.  *If you need a refill on your cardiac medications before your next appointment, please call your pharmacy*   Lab Work: None ordered  If you have labs (blood work) drawn today and your tests are completely normal, you will receive your results only by: Marland Kitchen MyChart Message (if you have MyChart) OR . A paper copy in the mail If you have any lab test that is abnormal or we need to change your treatment, we will call you to review the results.   Testing/Procedures: None ordered    Follow-Up: At [facility], you and your health needs are our priority.  As part of our continuing mission to provide you with exceptional heart care, we have created designated Provider Care Teams.  These Care Teams include your primary Cardiologist (physician) and Advanced Practice Providers (APPs -  Physician Assistants and Nurse Practitioners) who all work together to provide you with the care you need, when you need it.  We recommend signing up for the patient portal called "MyChart".  Sign up information is provided on this After Visit Summary.  MyChart is used to connect with patients for Virtual Visits (Telemedicine).  Patients are able to view lab/test results, encounter notes, upcoming appointments, etc.  Non-urgent messages can be sent to your provider as well.   To learn more about what you can do with MyChart, go to [email or web address].    Your next appointment:   6 month(s)  The format for your next appointment:   In Person  Provider:    You may see Kathlyn Sacramento, MD or Murray Hodgkins, NP.

## 2020-06-27 ENCOUNTER — Ambulatory Visit (INDEPENDENT_AMBULATORY_CARE_PROVIDER_SITE_OTHER): Payer: Medicare PPO | Admitting: Cardiovascular Disease

## 2020-06-27 ENCOUNTER — Other Ambulatory Visit: Payer: Self-pay

## 2020-06-27 ENCOUNTER — Encounter: Payer: Self-pay | Admitting: Cardiovascular Disease

## 2020-06-27 VITALS — BP 140/82 | HR 73 | Ht 71.0 in | Wt 203.1 lb

## 2020-06-27 DIAGNOSIS — I2510 Atherosclerotic heart disease of native coronary artery without angina pectoris: Secondary | ICD-10-CM | POA: Diagnosis not present

## 2020-06-27 DIAGNOSIS — I10 Essential (primary) hypertension: Secondary | ICD-10-CM

## 2020-06-27 DIAGNOSIS — E785 Hyperlipidemia, unspecified: Secondary | ICD-10-CM | POA: Diagnosis not present

## 2020-06-27 MED ORDER — ROSUVASTATIN CALCIUM 20 MG PO TABS: 20.0000 mg | ORAL_TABLET | Freq: Every day | ORAL | 1 refills | Status: AC

## 2020-06-27 NOTE — Progress Notes (Signed)
Cardiology Office Note   Date:  06/27/2020   ID:  Steve Howell, DOB 1956/03/12, MRN [id number]  PCP:  System, Provider Not In  Cardiologist:   Kathlyn Sacramento, MD   Chief Complaint  Patient presents with  . OTHER    6 month f/u no complaints today. Pt mentioned he D/C all medications on his own. Meds reviewed verbally wtih pt.      History of Present Illness: Steve Howell is a 64 y.o. male who presents for a follow-up visit regarding coronary artery disease.  He has chronic medical conditions that include hypertension, hyperlipidemia, nephrolithiasis and obstructive sleep apnea. He was hospitalized in November, 2019 with non-STEMI.  Cardiac catheterization showed severe single-vessel coronary artery disease affecting the distal RCA.  This was treated with PCI and drug-eluting stent placement.  No other obstructive disease.  Ejection fraction was normal. He has been doing well with no recent chest pain, shortness of breath or palpitations.   The patient is in the process of moving to [place] after going through divorce. He developed hyperglycemia in July and he thought it was due to treatment with atorvastatin after he read online. He stopped taking all his medications including clopidogrel, atorvastatin and carvedilol. He continued to take aspirin 81 mg daily. He denies chest pain or shortness of breath. He had labs done with a new primary care physician which showed hemoglobin A1c of 9.2. The rest of his labs were unremarkable.    Past Medical History:  Diagnosis Date  . Arthritis    "hands, was in my knees" (07/28/2018)  . Coronary artery disease    a. 07/2018 NSTEMI/PCI: LM nl, LAD 25p/m, LCX nl, OM2 20, RCA 95d (2.5x20 Synergy DES), EF 55-65%.  Marland Kitchen GERD (gastroesophageal reflux disease)   . Glaucoma, right eye   . History of kidney stones   . Hypertension    hx of, no medications needed now  . LVH (left ventricular hypertrophy)    a. 07/2018 Echo: EF 60-65%, mild  LVH.  . OSA on CPAP   . Pneumonia    "couple times; nothing in more than 10 yrs" (07/28/2018)  . Wears glasses     Past Surgical History:  Procedure Laterality Date  . BACK SURGERY    . CATARACT EXTRACTION W/ INTRAOCULAR LENS IMPLANT Right 2015  . CATARACT EXTRACTION W/ INTRAOCULAR LENS IMPLANT Right   . COLONOSCOPY    . CORONARY ANGIOPLASTY WITH STENT PLACEMENT  07/28/2018  . CORONARY STENT INTERVENTION N/A 07/28/2018   Procedure: CORONARY STENT INTERVENTION;  Surgeon: Burnell Blanks, MD;  Location: [facility];  Service: Cardiovascular;  Laterality: N/A;  . CYSTOSCOPY WITH RETROGRADE PYELOGRAM, URETEROSCOPY AND STENT PLACEMENT Bilateral 07/08/2014   Procedure: CYSTOSCOPY WITH BILATERAL RETROGRADE PYELOGRAM, right STENT PLACEMENT;  Surgeon: Festus Aloe, MD;  Location: [facility];  Service: Urology;  Laterality: Bilateral;  . CYSTOSCOPY WITH URETEROSCOPY AND STENT PLACEMENT Right 07/29/2014   Procedure: CYSTO  WITH RIGHT URETEROSCOPY/LITHOTRIPSY/STENT rePLACEMENT;  Surgeon: Festus Aloe, MD;  Location: [facility];  Service: Urology;  Laterality: Right;  . EYE SURGERY    . GLAUCOMA SURGERY Right 09/2015  . HOLMIUM LASER APPLICATION Right 41/32/4401   Procedure: HOLMIUM LASER APPLICATION;  Surgeon: Festus Aloe, MD;  Location: [facility];  Service: Urology;  Laterality: Right;  . LEFT HEART CATH AND CORONARY ANGIOGRAPHY N/A 07/28/2018   Procedure: LEFT HEART CATH AND CORONARY ANGIOGRAPHY;  Surgeon: Burnell Blanks, MD;  Location:  [facility];  Service: Cardiovascular;  Laterality: N/A;  . LUMBAR DISC SURGERY  2000   L4-5  . PHOTOCOAGULATION WITH LASER Right 11/04/2016   Procedure: transcleral diode cyclophotocoagulation;  Surgeon: Ronnell Freshwater, MD;  Location: [place];  Service: Ophthalmology;  Laterality: Right;  . REFRACTIVE SURGERY Right    "multiple times"  . RETINAL  DETACHMENT SURGERY Right 04/04/2014   2015  . TONSILLECTOMY       No current outpatient medications on file.   No current facility-administered medications for this visit.    Allergies:   Acetazolamide, Brimonidine tartrate-timolol, Other, and Brinzolamide-brimonidine    Social History:  The patient  reports that he quit smoking about 26 years ago. His smoking use included cigarettes. He has a 20.00 pack-year smoking history. He has never used smokeless tobacco. He reports current alcohol use of about 17.0 standard drinks of alcohol per week. He reports that he does not use drugs.   Family History:  The patient's family history is not on file.    ROS:  Please see the history of present illness.   Otherwise, review of systems are positive for none.   All other systems are reviewed and negative.    PHYSICAL EXAM: VS:  BP 140/82 (BP Location: Left Arm, Patient Position: Sitting, Cuff Size: Normal)   Pulse 73   Ht 5\' 11"  (1.803 m)   Wt 203 lb 2 oz (92.1 kg)   SpO2 98%   BMI 28.33 kg/m  , BMI Body mass index is 28.33 kg/m. GEN: Well nourished, well developed, in no acute distress  HEENT: normal  Neck: no JVD, carotid bruits, or masses Cardiac: RRR; no murmurs, rubs, or gallops,no edema  Respiratory:  clear to auscultation bilaterally, normal work of breathing GI: soft, nontender, nondistended, + BS MS: no deformity or atrophy  Skin: warm and dry, no rash Neuro:  Strength and sensation are intact Psych: euthymic mood, full affect   EKG:  EKG is ordered today. The ekg ordered today demonstrates normal sinus rhythm with no significant ST or T wave changes.   Recent Labs: No results found for requested labs within last 8760 hours.    Lipid Panel    Component Value Date/Time   CHOL 104 02/10/2019 0924   CHOL 199 03/31/2018 1515   TRIG 154 (H) 02/10/2019 0924   HDL 30 (L) 02/10/2019 0924   HDL 32 (L) 03/31/2018 1515   CHOLHDL 3.5 02/10/2019 0924   VLDL 31 02/10/2019  0924   LDLCALC 43 02/10/2019 0924   LDLCALC 100 (H) 03/31/2018 1515      Wt Readings from Last 3 Encounters:  06/27/20 203 lb 2 oz (92.1 kg)  12/17/19 212 lb 12.8 oz (96.5 kg)  05/06/19 209 lb 4 oz (94.9 kg)       No flowsheet data found.    ASSESSMENT AND PLAN:  1.  Coronary artery disease involving native coronary arteries without angina: He is doing well overall with no anginal symptoms. No need to resume clopidogrel given that it has been almost a year since his myocardial infarction and stent placement. Nonetheless, I do advise to continue aspirin 81 mg indefinitely.  2.  Hyperlipidemia: I had a prolonged discussion with the patient about treatment with a statin and risk of developing diabetes. Given his known coronary artery disease and recently diagnosed diabetes, there is a strong indication for treatment with a statin. We agreed to start rosuvastatin 20 mg daily. Recommend a target LDL of  less than 70.  3.  Essential hypertension: He stopped taking small dose carvedilol on his own. He reports that he has been checking his blood pressure at home and blood pressure usually ranges between 120 to 140 mmHg.   Disposition:   FU with me in 6 months. The patient might establish with a cardiologist in [place] where he lives.  Signed,  Kathlyn Sacramento, MD  06/27/2020 4:51 PM    [facility]

## 2020-06-27 NOTE — Patient Instructions (Signed)
Medication Instructions:  Your physician has recommended you make the following change in your medication:  START Crestor 20 mg by mouth once a day.  *If you need a refill on your cardiac medications before your next appointment, please call your pharmacy*  Follow-Up: At [facility], you and your health needs are our priority.  As part of our continuing mission to provide you with exceptional heart care, we have created designated Provider Care Teams.  These Care Teams include your primary Cardiologist (physician) and Advanced Practice Providers (APPs -  Physician Assistants and Nurse Practitioners) who all work together to provide you with the care you need, when you need it.  We recommend signing up for the patient portal called "MyChart".  Sign up information is provided on this After Visit Summary.  MyChart is used to connect with patients for Virtual Visits (Telemedicine).  Patients are able to view lab/test results, encounter notes, upcoming appointments, etc.  Non-urgent messages can be sent to your provider as well.   To learn more about what you can do with MyChart, go to [email or web address].    Your next appointment:   6 month(s)  The format for your next appointment:   In Person  Provider:   You may see Kathlyn Sacramento, MD or one of the following Advanced Practice Providers on your designated Care Team:    Murray Hodgkins, NP  Christell Faith, PA-C  Marrianne Mood, PA-C  [place], [place]

## 2021-09-16 ENCOUNTER — Encounter: Payer: Self-pay | Admitting: Gastroenterology
